# Patient Record
Sex: Male | Born: 1960 | Race: White | Hispanic: No | Marital: Married | Smoking: Current every day smoker
Health system: Southern US, Community
[De-identification: ages and names within clinical notes are randomized; demographics above are authoritative.]

## PROBLEM LIST (undated history)

## (undated) DIAGNOSIS — E079 Disorder of thyroid, unspecified: Secondary | ICD-10-CM

## (undated) DIAGNOSIS — E785 Hyperlipidemia, unspecified: Secondary | ICD-10-CM

## (undated) DIAGNOSIS — E039 Hypothyroidism, unspecified: Secondary | ICD-10-CM

## (undated) DIAGNOSIS — K219 Gastro-esophageal reflux disease without esophagitis: Secondary | ICD-10-CM

## (undated) DIAGNOSIS — R911 Solitary pulmonary nodule: Secondary | ICD-10-CM

## (undated) DIAGNOSIS — Z972 Presence of dental prosthetic device (complete) (partial): Secondary | ICD-10-CM

## (undated) DIAGNOSIS — R072 Precordial pain: Secondary | ICD-10-CM

## (undated) DIAGNOSIS — N401 Enlarged prostate with lower urinary tract symptoms: Secondary | ICD-10-CM

## (undated) DIAGNOSIS — K759 Inflammatory liver disease, unspecified: Secondary | ICD-10-CM

## (undated) DIAGNOSIS — H409 Unspecified glaucoma: Secondary | ICD-10-CM

## (undated) HISTORY — DX: Unspecified glaucoma: H40.9

## (undated) HISTORY — DX: Disorder of thyroid, unspecified: E07.9

## (undated) HISTORY — DX: Hyperlipidemia, unspecified: E78.5

---

## 1988-04-06 HISTORY — PX: HEMORRHOID SURGERY: SHX153

## 2005-08-07 ENCOUNTER — Emergency Department: Payer: Self-pay | Admitting: Emergency Medicine

## 2005-08-13 ENCOUNTER — Ambulatory Visit: Payer: Self-pay | Admitting: Surgery

## 2011-03-17 ENCOUNTER — Emergency Department: Payer: Self-pay | Admitting: *Deleted

## 2012-05-06 ENCOUNTER — Ambulatory Visit: Payer: Self-pay | Admitting: Family Medicine

## 2012-05-06 LAB — DOT URINE DIP
Blood: NEGATIVE
Protein: NEGATIVE
Specific Gravity: 1.01 (ref 1.003–1.030)

## 2013-04-06 DIAGNOSIS — H409 Unspecified glaucoma: Secondary | ICD-10-CM

## 2013-04-06 HISTORY — DX: Unspecified glaucoma: H40.9

## 2014-01-29 ENCOUNTER — Ambulatory Visit: Payer: Self-pay | Admitting: Ophthalmology

## 2014-03-05 ENCOUNTER — Ambulatory Visit: Payer: Self-pay | Admitting: Ophthalmology

## 2014-03-21 ENCOUNTER — Ambulatory Visit: Payer: Self-pay | Admitting: Emergency Medicine

## 2014-03-21 LAB — DOT URINE DIP
BLOOD: NEGATIVE
Glucose,UR: NEGATIVE
Protein: NEGATIVE
Specific Gravity: 1.015 (ref 1.000–1.030)

## 2014-04-06 HISTORY — PX: EYE SURGERY: SHX253

## 2015-09-30 ENCOUNTER — Encounter: Payer: Self-pay | Admitting: *Deleted

## 2015-09-30 ENCOUNTER — Inpatient Hospital Stay: Admission: RE | Admit: 2015-09-30 | Payer: BLUE CROSS/BLUE SHIELD | Source: Ambulatory Visit

## 2015-09-30 ENCOUNTER — Encounter: Payer: Self-pay | Admitting: General Surgery

## 2015-09-30 ENCOUNTER — Ambulatory Visit (INDEPENDENT_AMBULATORY_CARE_PROVIDER_SITE_OTHER): Payer: BLUE CROSS/BLUE SHIELD | Admitting: General Surgery

## 2015-09-30 ENCOUNTER — Other Ambulatory Visit: Payer: Self-pay | Admitting: General Surgery

## 2015-09-30 VITALS — BP 122/68 | HR 70 | Resp 14 | Ht 68.0 in | Wt 177.0 lb

## 2015-09-30 DIAGNOSIS — K610 Anal abscess: Secondary | ICD-10-CM

## 2015-09-30 DIAGNOSIS — K611 Rectal abscess: Secondary | ICD-10-CM

## 2015-09-30 NOTE — Patient Instructions (Signed)
Perirectal Abscess An abscess is an infected area that contains a collection of pus. A perirectal abscess is an abscess that is near the opening of the anus or around the rectum. A perirectal abscess can cause a lot of pain, especially during bowel movements. CAUSES This condition is almost always caused by an infection that starts in an anal gland. RISK FACTORS This condition is more likely to develop in:  People with diabetes or inflammatory bowel disease.  People whose body defense system (immune system) is weak.  People who have anal sex.  People who have a sexually transmitted disease (STD).  People who have certain kinds of cancers, such as rectal carcinoma, leukemia, or lymphoma. SYMPTOMS The main symptom of this condition is pain. The pain may be a throbbing pain that gets worse during bowel movements. Other symptoms include:  Fever.  Swelling.  Redness.  Bleeding.  Constipation. DIAGNOSIS The condition is diagnosed with a physical exam. If the abscess is not visible, a health care provider may need to place a finger inside the rectum to find the abscess. Sometimes, imaging tests are done to determine the size and location of the abscess. These tests may include:  An ultrasound.  An MRI.  A CT scan. TREATMENT This condition is usually treated with incision and drainage surgery. Incision and drainage surgery involves making an incision over the abscess to drain the pus. Treatment may also involve antibiotic medicine, pain medicine, stool softeners, or laxatives. HOME CARE INSTRUCTIONS  Take medicines only as directed by your health care provider.  If you were prescribed an antibiotic, finish all of it even if you start to feel better.  To relieve pain, try sitting:  In a warm, shallow bath (sitz bath).  On a heating pad with the setting on low.  On an inflatable donut-shaped cushion.  Follow any diet instructions as directed by your health care  provider.  Keep all follow-up visits as directed by your health care provider. This is important. SEEK MEDICAL CARE IF:  Your abscess is bleeding.  You have pain, swelling, or redness that is getting worse.  You are constipated.  You feel ill.  You have muscle aches or chills.  You have a fever.  Your symptoms return after the abscess has healed.   This information is not intended to replace advice given to you by your health care provider. Make sure you discuss any questions you have with your health care provider.   Document Released: 03/20/2000 Document Revised: 12/12/2014 Document Reviewed: 01/31/2014 Elsevier Interactive Patient Education 2016 [employer]  

## 2015-09-30 NOTE — Patient Instructions (Signed)
  Your procedure is scheduled on: 10-01-15 Report to Same Day Surgery 2nd floor medical mall @ 9:30  Remember: Instructions that are not followed completely may result in serious medical risk, up to and including death, or upon the discretion of your surgeon and anesthesiologist your surgery may need to be rescheduled.    _x___ 1. Do not eat food or drink liquids after midnight. No gum chewing or hard candies.     __x__ 2. No Alcohol for 24 hours before or after surgery.   __x__3. No Smoking for 24 prior to surgery.   ____  4. Bring all medications with you on the day of surgery if instructed.    __x__ 5. Notify your doctor if there is any change in your medical condition     (cold, fever, infections).     Do not wear jewelry, make-up, hairpins, clips or nail polish.  Do not wear lotions, powders, or perfumes. You may wear deodorant.  Do not shave 48 hours prior to surgery. Men may shave face and neck.  Do not bring valuables to the hospital.    [facility] is not responsible for any belongings or valuables.               Contacts, dentures or bridgework may not be worn into surgery.  Leave your suitcase in the car. After surgery it may be brought to your room.  For patients admitted to the hospital, discharge time is determined by your treatment team.   Patients discharged the day of surgery will not be allowed to drive home.    Please read over the following fact sheets that you were given:   [facility] Preparing for Surgery and or MRSA Information   _x___ Take these medicines the morning of surgery with A SIP OF WATER:    1. LEVOTHYROXINE  2.  3.  4.  5.  6.  _X___ Fleet Enema (as directed)-1 HOUR PRIOR TO ARRIVAL TIME TO HOSPITAL   ____ Use CHG Soap or sage wipes as directed on instruction sheet   ____ Use inhalers on the day of surgery and bring to hospital day of surgery  ____ Stop metformin 2 days prior to surgery    ____ Take 1/2 of usual insulin dose the night  before surgery and none on the morning of surgery.   ____ Stop aspirin or coumadin, or plavix  ___ Stop Anti-inflammatories such as Advil, Aleve, Ibuprofen, Motrin, Naproxen,          Naprosyn, Goodies powders or aspirin products. Ok to take Tylenol.   ____ Stop supplements until after surgery.    ____ Bring C-Pap to the hospital.

## 2015-09-30 NOTE — Progress Notes (Signed)
Patient ID: William Ochoa, male   DOB: 01/16/1961, 55 y.o.   MRN: [id number]  Chief Complaint  Patient presents with  . Other    Rectal abscess    HPI NICOLI Ochoa is a 55 y.o. male here today for an evaluation for a perirectal abscess. The area increased in size for over the past 6 months. It has been painful the last two weeks. He did took an antibiotic for two weeks, does not remember the name. He finished this course last week with no improvement. It is painful to have bowel movements. He states the area has enlarged since this first started. He states he has not had a colonoscopy. He is a Administrator.   I personally reviewed the patient's history. HPI  Past Medical History  Diagnosis Date  . Glaucoma 2015  . Thyroid disease   . Hyperlipidemia   . Hypothyroidism     Past Surgical History  Procedure Laterality Date  . Hemorrhoid surgery  1990  . Eye surgery Left 2016    CORNEAL SURGERY  . Incision and drainage perirectal abscess N/A 10/01/2015    Procedure: IRRIGATION AND DEBRIDEMENT PERIRECTAL ABSCESS;  Surgeon: Robert Bellow, MD;  Location: [facility] ORS;  Service: General;  Laterality: N/A;  . Fistulotomy N/A 10/01/2015    Procedure: FISTULOTOMY;  Surgeon: Robert Bellow, MD;  Location: [facility] ORS;  Service: General;  Laterality: N/A;  . Sigmoidoscopy N/A 10/01/2015    Procedure: SIGMOIDOSCOPY;  Surgeon: Robert Bellow, MD;  Location: [facility] ORS;  Service: General;  Laterality: N/A;    Family History  Problem Relation Age of Onset  . Cancer Mother     skin  . Cancer Father     Prostate    Social History Social History  Substance Use Topics  . Smoking status: Current Every Day Smoker -- 1.50 packs/day for 30 years    Types: Cigarettes  . Smokeless tobacco: Never Used  . Alcohol Use: No     Comment: SOBER FOR 23 YEARS    No Known Allergies  Current Outpatient Prescriptions  Medication Sig Dispense Refill  . levothyroxine (SYNTHROID, LEVOTHROID) 125  MCG tablet Take 125 mcg by mouth daily before breakfast.   0  . Multiple Vitamins-Minerals (CENTRUM ADULTS PO) Take by mouth.    . simvastatin (ZOCOR) 20 MG tablet Take 20 mg by mouth daily at 6 PM.   0  . HYDROcodone-acetaminophen (NORCO) 5-325 MG tablet Take 1-2 tablets by mouth every 4 (four) hours as needed for moderate pain. 30 tablet 0  . naproxen sodium (ANAPROX) 220 MG tablet Take 220 mg by mouth as needed. Reported on 10/01/2015     No current facility-administered medications for this visit.    Review of Systems Review of Systems  Constitutional: Negative.   Respiratory: Negative.   Cardiovascular: Negative.   Gastrointestinal: Positive for constipation and rectal pain.    Blood pressure 122/68, pulse 70, resp. rate 14, height 5\' 8"  (1.727 m), weight 177 lb (80.287 kg).  Physical Exam Physical Exam  Constitutional: He is oriented to person, place, and time. He appears well-developed and well-nourished.  Eyes: Conjunctivae are normal. No scleral icterus.  Neck: Neck supple.  Cardiovascular: Normal rate, regular rhythm and normal heart sounds.   Pulses:      Carotid pulses are 2+ on the right side, and 2+ on the left side.      Radial pulses are 2+ on the right side, and 2+ on the left side.  Femoral pulses are 2+ on the right side, and 2+ on the left side.      Popliteal pulses are 2+ on the right side, and 2+ on the left side.       Dorsalis pedis pulses are 2+ on the right side, and 2+ on the left side.  Pulmonary/Chest: Effort normal and breath sounds normal.  Abdominal: Soft. Normal appearance and bowel sounds are normal. There is no tenderness.  Genitourinary:     Thickening along the midline about halfway to the scrotum. Perirectal abscess on the right.   Lymphadenopathy:    He has no cervical adenopathy.  Neurological: He is alert and oriented to person, place, and time.  Skin: Skin is warm and dry.    Data Reviewed PCP note.  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    Plan    Patient's surgery has been scheduled for 10-01-15 at [facility]. This patient will make use of a Fleets enema prep one hour prior to O.R.     PCP: Dr. Sharin Mons This has been scribed by Lesly Rubenstein LPN   Robert Bellow 10/01/2015, 7:08 PM

## 2015-10-01 ENCOUNTER — Ambulatory Visit: Payer: BLUE CROSS/BLUE SHIELD | Admitting: Anesthesiology

## 2015-10-01 ENCOUNTER — Ambulatory Visit
Admission: RE | Admit: 2015-10-01 | Discharge: 2015-10-01 | Disposition: A | Discharge status: Home / Self Care | Payer: BLUE CROSS/BLUE SHIELD | Source: Ambulatory Visit | Attending: General Surgery | Admitting: General Surgery

## 2015-10-01 ENCOUNTER — Encounter: Payer: Self-pay | Admitting: *Deleted

## 2015-10-01 ENCOUNTER — Encounter
Admission: RE | Disposition: A | Discharge status: Home / Self Care | Payer: Self-pay | Source: Ambulatory Visit | Attending: General Surgery

## 2015-10-01 DIAGNOSIS — E039 Hypothyroidism, unspecified: Secondary | ICD-10-CM | POA: Diagnosis not present

## 2015-10-01 DIAGNOSIS — K611 Rectal abscess: Secondary | ICD-10-CM | POA: Insufficient documentation

## 2015-10-01 DIAGNOSIS — K610 Anal abscess: Secondary | ICD-10-CM

## 2015-10-01 DIAGNOSIS — L02215 Cutaneous abscess of perineum: Secondary | ICD-10-CM | POA: Insufficient documentation

## 2015-10-01 DIAGNOSIS — F17210 Nicotine dependence, cigarettes, uncomplicated: Secondary | ICD-10-CM | POA: Diagnosis not present

## 2015-10-01 DIAGNOSIS — J449 Chronic obstructive pulmonary disease, unspecified: Secondary | ICD-10-CM | POA: Insufficient documentation

## 2015-10-01 HISTORY — PX: FISTULOTOMY: SHX6413

## 2015-10-01 HISTORY — PX: SIGMOIDOSCOPY: SHX6686

## 2015-10-01 HISTORY — DX: Hypothyroidism, unspecified: E03.9

## 2015-10-01 HISTORY — PX: INCISION AND DRAINAGE PERIRECTAL ABSCESS: SHX1804

## 2015-10-01 LAB — URINE DRUG SCREEN, QUALITATIVE (ARMC ONLY)
Amphetamines, Ur Screen: NOT DETECTED
BARBITURATES, UR SCREEN: NOT DETECTED
Benzodiazepine, Ur Scrn: NOT DETECTED
COCAINE METABOLITE, UR ~~LOC~~: NOT DETECTED
Cannabinoid 50 Ng, Ur ~~LOC~~: NOT DETECTED
MDMA (ECSTASY) UR SCREEN: NOT DETECTED
METHADONE SCREEN, URINE: NOT DETECTED
Opiate, Ur Screen: NOT DETECTED
Phencyclidine (PCP) Ur S: NOT DETECTED
TRICYCLIC, UR SCREEN: NOT DETECTED

## 2015-10-01 SURGERY — INCISION AND DRAINAGE, ABSCESS, PERIRECTAL
Anesthesia: General

## 2015-10-01 MED ORDER — LACTATED RINGERS IV SOLN
INTRAVENOUS | Status: DC
  Administered 2015-10-01: 50 mL/h via INTRAVENOUS

## 2015-10-01 MED ORDER — BUPIVACAINE-EPINEPHRINE 0.5% -1:200000 IJ SOLN
INTRAMUSCULAR | Status: DC | PRN
  Administered 2015-10-01: 30 mL

## 2015-10-01 MED ORDER — LIDOCAINE HCL (CARDIAC) 20 MG/ML IV SOLN
INTRAVENOUS | Status: DC | PRN
  Administered 2015-10-01: 100 mg via INTRAVENOUS

## 2015-10-01 MED ORDER — ACETAMINOPHEN 10 MG/ML IV SOLN
INTRAVENOUS | Status: DC | PRN
Start: 2015-10-01 — End: 2015-10-01
  Administered 2015-10-01: 1000 mg via INTRAVENOUS

## 2015-10-01 MED ORDER — KETOROLAC TROMETHAMINE 30 MG/ML IJ SOLN
INTRAMUSCULAR | Status: DC | PRN
  Administered 2015-10-01: 30 mg via INTRAVENOUS

## 2015-10-01 MED ORDER — HYDROCODONE-ACETAMINOPHEN 5-325 MG PO TABS: 1.0000 | ORAL_TABLET | ORAL | Status: DC | PRN

## 2015-10-01 MED ORDER — FAMOTIDINE 20 MG PO TABS
20.0000 mg | ORAL_TABLET | Freq: Once | ORAL | Status: AC
  Administered 2015-10-01: 20 mg via ORAL

## 2015-10-01 MED ORDER — FENTANYL CITRATE (PF) 100 MCG/2ML IJ SOLN: 25.0000 ug | INTRAMUSCULAR | Status: DC | PRN

## 2015-10-01 MED ORDER — BUPIVACAINE-EPINEPHRINE (PF) 0.5% -1:200000 IJ SOLN
INTRAMUSCULAR | Status: AC
  Filled 2015-10-01: qty 30

## 2015-10-01 MED ORDER — GLYCOPYRROLATE 0.2 MG/ML IJ SOLN
INTRAMUSCULAR | Status: DC | PRN
  Administered 2015-10-01: 0.2 mg via INTRAVENOUS

## 2015-10-01 MED ORDER — FAMOTIDINE 20 MG PO TABS
ORAL_TABLET | ORAL | Status: AC
  Administered 2015-10-01: 20 mg
  Filled 2015-10-01: qty 1

## 2015-10-01 MED ORDER — BUPIVACAINE LIPOSOME 1.3 % IJ SUSP
INTRAMUSCULAR | Status: AC
  Filled 2015-10-01: qty 20

## 2015-10-01 MED ORDER — MIDAZOLAM HCL 2 MG/2ML IJ SOLN
INTRAMUSCULAR | Status: DC | PRN
  Administered 2015-10-01: 2 mg via INTRAVENOUS

## 2015-10-01 MED ORDER — FENTANYL CITRATE (PF) 100 MCG/2ML IJ SOLN
INTRAMUSCULAR | Status: DC | PRN
  Administered 2015-10-01 (×3): 50 ug via INTRAVENOUS

## 2015-10-01 MED ORDER — ONDANSETRON HCL 4 MG/2ML IJ SOLN: 4.0000 mg | Freq: Once | INTRAMUSCULAR | Status: DC | PRN

## 2015-10-01 MED ORDER — SODIUM CHLORIDE 0.9 % IV SOLN
1.0000 g | INTRAVENOUS | Status: AC
  Administered 2015-10-01: 1 g via INTRAVENOUS
  Filled 2015-10-01: qty 1

## 2015-10-01 MED ORDER — ONDANSETRON HCL 4 MG/2ML IJ SOLN
INTRAMUSCULAR | Status: DC | PRN
  Administered 2015-10-01: 4 mg via INTRAVENOUS

## 2015-10-01 MED ORDER — PROPOFOL 10 MG/ML IV BOLUS
INTRAVENOUS | Status: DC | PRN
  Administered 2015-10-01: 160 mg via INTRAVENOUS

## 2015-10-01 MED ORDER — ACETAMINOPHEN 10 MG/ML IV SOLN
INTRAVENOUS | Status: AC
  Filled 2015-10-01: qty 100

## 2015-10-01 MED ORDER — BACITRACIN ZINC 500 UNIT/GM EX OINT
TOPICAL_OINTMENT | CUTANEOUS | Status: AC
  Filled 2015-10-01: qty 28.35

## 2015-10-01 SURGICAL SUPPLY — 35 items
BLADE SURG 11 STRL SS SAFETY (MISCELLANEOUS) ×3 IMPLANT
BLADE SURG 15 STRL SS SAFETY (BLADE) ×3 IMPLANT
BRIEF STRETCH MATERNITY 2XLG (MISCELLANEOUS) ×3 IMPLANT
CANISTER SUCT 1200ML W/VALVE (MISCELLANEOUS) ×3 IMPLANT
DRAIN PENROSE 5/8X18 LTX STRL (WOUND CARE) IMPLANT
DRAPE LAPAROTOMY 100X77 ABD (DRAPES) ×3 IMPLANT
DRAPE LAPAROTOMY T 102X78X121 (DRAPES) ×3 IMPLANT
DRAPE LEGGINS SURG 28X43 STRL (DRAPES) ×3 IMPLANT
DRAPE UNDER BUTTOCK W/FLU (DRAPES) ×3 IMPLANT
DRSG GAUZE PETRO 6X36 STRIP ST (GAUZE/BANDAGES/DRESSINGS) ×3 IMPLANT
ELECT REM PT RETURN 9FT ADLT (ELECTROSURGICAL) ×3
ELECTRODE REM PT RTRN 9FT ADLT (ELECTROSURGICAL) ×1 IMPLANT
GLOVE BIO SURGEON STRL SZ7.5 (GLOVE) ×12 IMPLANT
GLOVE INDICATOR 8.0 STRL GRN (GLOVE) ×6 IMPLANT
GOWN STRL REUS W/ TWL LRG LVL3 (GOWN DISPOSABLE) ×3 IMPLANT
GOWN STRL REUS W/TWL LRG LVL3 (GOWN DISPOSABLE) ×6
KIT RM TURNOVER STRD PROC AR (KITS) ×3 IMPLANT
LABEL OR SOLS (LABEL) ×3 IMPLANT
NDL SAFETY 22GX1.5 (NEEDLE) ×3 IMPLANT
NEEDLE HYPO 25X1 1.5 SAFETY (NEEDLE) ×3 IMPLANT
NS IRRIG 500ML POUR BTL (IV SOLUTION) ×3 IMPLANT
PACK BASIN MINOR ARMC (MISCELLANEOUS) ×3 IMPLANT
PAD OB MATERNITY 4.3X12.25 (PERSONAL CARE ITEMS) ×3 IMPLANT
PAD PREP 24X41 OB/GYN DISP (PERSONAL CARE ITEMS) ×3 IMPLANT
SOL PREP PVP 2OZ (MISCELLANEOUS) ×3
SOLUTION PREP PVP 2OZ (MISCELLANEOUS) ×1 IMPLANT
SUCT SIGMOIDOSCOPE TIP 18 W/TU (SUCTIONS) ×3 IMPLANT
SURGILUBE 2OZ TUBE FLIPTOP (MISCELLANEOUS) ×3 IMPLANT
SUT SILK 0 CT 1 30 (SUTURE) IMPLANT
SUT SILK 0 SH 30 (SUTURE) IMPLANT
SUT VIC AB 3-0 SH 27 (SUTURE) ×2
SUT VIC AB 3-0 SH 27X BRD (SUTURE) ×1 IMPLANT
SWAB CULTURE AMIES ANAERIB BLU (MISCELLANEOUS) IMPLANT
SYR BULB IRRIG 60ML STRL (SYRINGE) ×3 IMPLANT
SYR CONTROL 10ML (SYRINGE) ×3 IMPLANT

## 2015-10-01 NOTE — Transfer of Care (Signed)
Immediate Anesthesia Transfer of Care Note  Patient: William Ochoa  Procedure(s) Performed: Procedure(s): IRRIGATION AND DEBRIDEMENT PERIRECTAL ABSCESS (N/A) FISTULOTOMY (N/A) SIGMOIDOSCOPY (N/A)  Patient Location: PACU  Anesthesia Type:General  Level of Consciousness: sedated  Airway & Oxygen Therapy: Patient Spontanous Breathing and Patient connected to face mask oxygen  Post-op Assessment: Report given to RN and Post -op Vital signs reviewed and stable  Post vital signs: Reviewed and stable  Last Vitals:  Filed Vitals:   10/01/15 1000 10/01/15 1203  BP: 121/83 86/54  Pulse: 70   Temp: 36.2 C 36.2 C  Resp: 17 12    Complications: No apparent anesthesia complications

## 2015-10-01 NOTE — Op Note (Signed)
Preoperative diagnosis: Perineal abscess, suspected fistula.  Postoperative diagnosis same.  Operative procedure: Rigid sigmoidoscopy, anoscopy, debridement of perineal abscess.  Operating surgeon: Hervey Ard, M.D.  Anesthesia: Gen. by LMA, Marcaine 0.5% with 1-200,000 units of epinephrine: 30 mL local infiltration.  Estimated blood loss: Less than 5 mL.  Clinical note: This 55 year old male related to 6 month plus history of intermittent pain and swelling in the perineum. Examination showed evidence of a abscess between the anus and the scrotum as well as a draining sinus at the 10:00 position (dorsal lithotomy). 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  Operative note: The patient received Invanz prior to the procedure. He underwent general anesthesia without difficulty. The perineum was prepped with Betadine solution and draped. Marcaine was infiltrated for postoperative analgesia.  Rigid sigmoidoscopy was carried out to 20 cm. No mucosal lesions were identified.  The fistula site was cannulated with a lacrimal duct probe and the anterior superior extent towards the base of the scrotum identified. The skin was incised sharply and the remaining dissection completed with electrocautery. A chronic abscess cavity was entered and this was debrided with a curet. A secondary cavity deep to the superficial wound measuring less than a centimeter in diameter and 6 mm in depth was cleared as well. Probing showed a tract extending to but not reaching the dentate line. This was exposed for its length. Hemostasis was electrocautery. Bacitracin ointment was applied to the wound. Dry dressing and mesh underwear applied.  The patient was taken to recovery in stable condition.

## 2015-10-01 NOTE — Anesthesia Preprocedure Evaluation (Addendum)
Anesthesia Evaluation  Patient identified by MRN, date of birth, ID band Patient awake    Reviewed: Allergy & Precautions, NPO status , Patient's Chart, lab work & pertinent test results  Airway Mallampati: II       Dental  (+) Edentulous Upper   Pulmonary COPD, Current Smoker,    + rhonchi  + decreased breath sounds      Cardiovascular Exercise Tolerance: Good  Rhythm:Regular     Neuro/Psych    GI/Hepatic negative GI ROS,   Endo/Other  Hypothyroidism   Renal/GU negative Renal ROS     Musculoskeletal negative musculoskeletal ROS (+)   Abdominal   Peds  Hematology   Anesthesia Other Findings   Reproductive/Obstetrics                            Anesthesia Physical Anesthesia Plan  ASA: II  Anesthesia Plan: General   Post-op Pain Management:    Induction: Intravenous  Airway Management Planned: LMA  Additional Equipment:   Intra-op Plan:   Post-operative Plan: Extubation in OR  Informed Consent: I have reviewed the patients History and Physical, chart, labs and discussed the procedure including the risks, benefits and alternatives for the proposed anesthesia with the patient or authorized representative who has indicated his/her understanding and acceptance.     Plan Discussed with: CRNA  Anesthesia Plan Comments:         Anesthesia Quick Evaluation

## 2015-10-01 NOTE — H&P (Signed)
No change in clinical history or exam.  For EUA, fistulotomy, abscess drainage.

## 2015-10-01 NOTE — Anesthesia Procedure Notes (Signed)
Procedure Name: LMA Insertion Date/Time: 10/01/2015 11:19 AM Performed by: Doreen Salvage Pre-anesthesia Checklist: Patient identified, Emergency Drugs available, Suction available and Patient being monitored Patient Re-evaluated:Patient Re-evaluated prior to inductionOxygen Delivery Method: Circle system utilized Preoxygenation: Pre-oxygenation with 100% oxygen Intubation Type: IV induction Ventilation: Mask ventilation without difficulty LMA: LMA inserted LMA Size: 4.5 Number of attempts: 1 Placement Confirmation: positive ETCO2 and breath sounds checked- equal and bilateral Tube secured with: Tape Dental Injury: Teeth and Oropharynx as per pre-operative assessment

## 2015-10-01 NOTE — Anesthesia Postprocedure Evaluation (Signed)
Anesthesia Post Note  Patient: William Ochoa  Procedure(s) Performed: Procedure(s) (LRB): IRRIGATION AND DEBRIDEMENT PERIRECTAL ABSCESS (N/A) FISTULOTOMY (N/A) SIGMOIDOSCOPY (N/A)  Patient location during evaluation: PACU Anesthesia Type: General Level of consciousness: awake Pain management: satisfactory to patient Vital Signs Assessment: post-procedure vital signs reviewed and stable Respiratory status: respiratory function stable Cardiovascular status: stable Anesthetic complications: no    Last Vitals:  Filed Vitals:   10/01/15 1000 10/01/15 1203  BP: 121/83 86/54  Pulse: 70 71  Temp: 36.2 C 36.2 C  Resp: 17 12    Last Pain:  Filed Vitals:   10/01/15 1212  PainSc: Asleep                 [name],William Marlette

## 2015-10-09 ENCOUNTER — Ambulatory Visit (INDEPENDENT_AMBULATORY_CARE_PROVIDER_SITE_OTHER): Payer: BLUE CROSS/BLUE SHIELD | Admitting: General Surgery

## 2015-10-09 ENCOUNTER — Encounter: Payer: Self-pay | Admitting: *Deleted

## 2015-10-09 VITALS — BP 120/80 | HR 77 | Temp 98.1°F | Resp 13 | Ht 68.0 in | Wt 177.0 lb

## 2015-10-09 DIAGNOSIS — K611 Rectal abscess: Secondary | ICD-10-CM

## 2015-10-09 MED ORDER — HYDROCODONE-ACETAMINOPHEN 5-325 MG PO TABS: 1.0000 | ORAL_TABLET | ORAL | Status: DC | PRN

## 2015-10-09 NOTE — Patient Instructions (Signed)
Use Aleve two twice daily for pain and swelling. May try heat or cold as needed for comfort.

## 2015-10-09 NOTE — Progress Notes (Addendum)
Patient ID: William Ochoa, male   DOB: September 14, 1960, 55 y.o.   MRN: [id number]  Chief Complaint  Patient presents with  . Wound Check    HPI William Ochoa is a 55 y.o. male here for wound check. He states that the preirectal area is still very sore and he reports a lot of drainage. He reports that he is having to to take his pain medication every 4 hours. His surgery was on 10/01/15. He denies any chills or fevers. He states that the area feels worse now than before surgery.   The patient reports no difficulty controlling his bowels. HPI  Past Medical History:  Diagnosis Date  . Glaucoma 2015  . Hyperlipidemia   . Hypothyroidism   . Thyroid disease     Past Surgical History:  Procedure Laterality Date  . EYE SURGERY Left 2016   CORNEAL SURGERY  . FISTULOTOMY N/A 10/01/2015   Procedure: FISTULOTOMY;  Surgeon: Robert Bellow, MD;  Location: [facility] ORS;  Service: General;  Laterality: N/A;  . [place]  . INCISION AND DRAINAGE PERIRECTAL ABSCESS N/A 10/01/2015   Procedure: IRRIGATION AND DEBRIDEMENT PERIRECTAL ABSCESS;  Surgeon: Robert Bellow, MD;  Location: [facility] ORS;  Service: General;  Laterality: N/A;  . SIGMOIDOSCOPY N/A 10/01/2015   Procedure: [place] Face;  Surgeon: Robert Bellow, MD;  Location: [facility] ORS;  Service: General;  Laterality: N/A;    Family History  Problem Relation Age of Onset  . Cancer Mother     skin  . Cancer Father     Prostate    Social History Social History  Substance Use Topics  . Smoking status: Current Every Day Smoker    Packs/day: 1.50    Years: 30.00    Types: Cigarettes  . Smokeless tobacco: Never Used  . Alcohol use No     Comment: SOBER FOR 23 YEARS    No Known Allergies  Current Outpatient Prescriptions  Medication Sig Dispense Refill  . levothyroxine (SYNTHROID, LEVOTHROID) 125 MCG tablet Take 125 mcg by mouth daily before breakfast.   0  . Multiple Vitamins-Minerals (CENTRUM ADULTS PO) Take by  mouth.    . naproxen sodium (ANAPROX) 220 MG tablet Take 220 mg by mouth as needed. Reported on 10/01/2015    . simvastatin (ZOCOR) 20 MG tablet Take 20 mg by mouth daily at 6 PM.   0  . docusate sodium (COLACE) 100 MG capsule Take 100 mg by mouth daily.    Marland Kitchen HYDROcodone-acetaminophen (NORCO) 5-325 MG tablet Take 1-2 tablets by mouth every 4 (four) hours as needed for moderate pain. 30 tablet 0   No current facility-administered medications for this visit.     Review of Systems Review of Systems  Constitutional: Negative.   Respiratory: Negative.   Cardiovascular: Negative.     Blood pressure 120/80, pulse 77, temperature 98.1 F (36.7 C), resp. rate 13, height 5\' 8"  (1.727 m), weight 177 lb (80.3 kg).  Physical Exam Physical Exam  Genitourinary:     Area healing well     Assessment    Clean perineal wound status post exploration and debridement.    Plan    The patient was reassured that this area will improve. Continue present anti-inflammatory regimen. Hydrocodone as needed.    PCP: Sharin Mons This has been scribed by Lesly Rubenstein LPN    Robert Bellow 10/31/2015, 12:15 PM

## 2015-10-15 ENCOUNTER — Encounter: Payer: Self-pay | Admitting: General Surgery

## 2015-10-15 ENCOUNTER — Ambulatory Visit (INDEPENDENT_AMBULATORY_CARE_PROVIDER_SITE_OTHER): Payer: BLUE CROSS/BLUE SHIELD | Admitting: General Surgery

## 2015-10-15 VITALS — BP 100/60 | HR 70 | Resp 12 | Ht 68.0 in | Wt 177.0 lb

## 2015-10-15 DIAGNOSIS — K611 Rectal abscess: Secondary | ICD-10-CM

## 2015-10-15 NOTE — Patient Instructions (Signed)
Call office with any concerns.

## 2015-10-15 NOTE — Progress Notes (Signed)
Patient ID: William Ochoa, male   DOB: 1960/07/14, 55 y.o.   MRN: [id number]  Chief Complaint  Patient presents with  . Routine Post Op    HPI William Ochoa is a 55 y.o. male.  Here today for postoperative visit, I/D perirectal abscess and fistulotomy on 10-01-15. He states he is still having some drainage but it is much less. He is still taking the hydrocodone but less often and plans to return to work Monday.   HPI  Past Medical History  Diagnosis Date  . Glaucoma 2015  . Thyroid disease   . Hyperlipidemia   . Hypothyroidism     Past Surgical History  Procedure Laterality Date  . Hemorrhoid surgery  1990  . Eye surgery Left 2016    CORNEAL SURGERY  . Incision and drainage perirectal abscess N/A 10/01/2015    Procedure: IRRIGATION AND DEBRIDEMENT PERIRECTAL ABSCESS;  Surgeon: Robert Bellow, MD;  Location: [facility] ORS;  Service: General;  Laterality: N/A;  . Fistulotomy N/A 10/01/2015    Procedure: FISTULOTOMY;  Surgeon: Robert Bellow, MD;  Location: [facility] ORS;  Service: General;  Laterality: N/A;  . Sigmoidoscopy N/A 10/01/2015    Procedure: SIGMOIDOSCOPY;  Surgeon: Robert Bellow, MD;  Location: [facility] ORS;  Service: General;  Laterality: N/A;    Family History  Problem Relation Age of Onset  . Cancer Mother     skin  . Cancer Father     Prostate    Social History Social History  Substance Use Topics  . Smoking status: Current Every Day Smoker -- 1.50 packs/day for 30 years    Types: Cigarettes  . Smokeless tobacco: Never Used  . Alcohol Use: No     Comment: SOBER FOR 23 YEARS    No Known Allergies  Current Outpatient Prescriptions  Medication Sig Dispense Refill  . docusate sodium (COLACE) 100 MG capsule Take 100 mg by mouth daily.    Marland Kitchen HYDROcodone-acetaminophen (NORCO) 5-325 MG tablet Take 1-2 tablets by mouth every 4 (four) hours as needed for moderate pain. 30 tablet 0  . levothyroxine (SYNTHROID, LEVOTHROID) 125 MCG tablet Take 125 mcg by mouth  daily before breakfast.   0  . Multiple Vitamins-Minerals (CENTRUM ADULTS PO) Take by mouth.    . naproxen sodium (ANAPROX) 220 MG tablet Take 220 mg by mouth as needed. Reported on 10/01/2015    . simvastatin (ZOCOR) 20 MG tablet Take 20 mg by mouth daily at 6 PM.   0   No current facility-administered medications for this visit.    Review of Systems Review of Systems  Constitutional: Negative.   Respiratory: Negative.   Cardiovascular: Negative.   Gastrointestinal: Negative for diarrhea and constipation.    Blood pressure 100/60, pulse 70, resp. rate 12, height 5\' 8"  (1.727 m), weight 177 lb (80.287 kg).  Physical Exam Physical Exam  Genitourinary:         Assessment    Steady progress status post incision and drainage of a perirectal abscess.    Plan    Continue local wound care.  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  Follow-up exam in 1 month.     PCP:  Brynda Greathouse,  This information has been scribed by Karie Fetch RN, BSN,BC.   Robert Bellow 10/15/2015, 9:41 PM

## 2015-11-12 ENCOUNTER — Encounter: Payer: Self-pay | Admitting: *Deleted

## 2015-11-19 ENCOUNTER — Encounter: Payer: Self-pay | Admitting: General Surgery

## 2015-11-19 ENCOUNTER — Ambulatory Visit (INDEPENDENT_AMBULATORY_CARE_PROVIDER_SITE_OTHER): Payer: BLUE CROSS/BLUE SHIELD | Admitting: General Surgery

## 2015-11-19 VITALS — BP 116/78 | HR 74 | Resp 16 | Ht 68.0 in | Wt 178.0 lb

## 2015-11-19 DIAGNOSIS — K611 Rectal abscess: Secondary | ICD-10-CM

## 2015-11-19 NOTE — Progress Notes (Addendum)
Patient ID: William Ochoa, male   DOB: 1960/10/02, 55 y.o.   MRN: [id number]  Chief Complaint  Patient presents with  . Follow-up    perirectal abscess    HPI William Ochoa is a 55 y.o. male here today for his follow up I &D perirectal abscess and fistulotomy on 10-01-15. Patient states he is doing well, reports only a little blood when he wipes.  HPI  Past Medical History:  Diagnosis Date  . Glaucoma 2015  . Hyperlipidemia   . Hypothyroidism   . Thyroid disease     Past Surgical History:  Procedure Laterality Date  . EYE SURGERY Left 2016   CORNEAL SURGERY  . FISTULOTOMY N/A 10/01/2015   Procedure: FISTULOTOMY;  Surgeon: Robert Bellow, MD;  Location: [facility];  Service: General;  Laterality: N/A;  . [place]  . INCISION AND DRAINAGE PERIRECTAL ABSCESS N/A 10/01/2015   Procedure: IRRIGATION AND DEBRIDEMENT PERIRECTAL ABSCESS;  Surgeon: Robert Bellow, MD;  Location: [facility];  Service: General;  Laterality: N/A;  . SIGMOIDOSCOPY N/A 10/01/2015   Procedure: Lonell Face;  Surgeon: Robert Bellow, MD;  Location: [facility];  Service: General;  Laterality: N/A;    Family History  Problem Relation Age of Onset  . Cancer Mother     skin  . Cancer Father     Prostate    Social History Social History  Substance Use Topics  . Smoking status: Current Every Day Smoker    Packs/day: 1.50    Years: 30.00    Types: Cigarettes  . Smokeless tobacco: Never Used  . Alcohol use No     Comment: SOBER FOR 23 YEARS    No Known Allergies  Current Outpatient Prescriptions  Medication Sig Dispense Refill  . docusate sodium (COLACE) 100 MG capsule Take 100 mg by mouth daily.    Marland Kitchen HYDROcodone-acetaminophen (NORCO) 5-325 MG tablet Take 1-2 tablets by mouth every 4 (four) hours as needed for moderate pain. 30 tablet 0  . levothyroxine (SYNTHROID, LEVOTHROID) 125 MCG tablet Take 125 mcg by mouth daily before breakfast.   0  . Multiple Vitamins-Minerals  (CENTRUM ADULTS PO) Take by mouth.    . naproxen sodium (ANAPROX) 220 MG tablet Take 220 mg by mouth as needed. Reported on 10/01/2015    . simvastatin (ZOCOR) 20 MG tablet Take 20 mg by mouth daily at 6 PM.   0   No current facility-administered medications for this visit.     Review of Systems Review of Systems  Constitutional: Negative.   Respiratory: Negative.   Cardiovascular: Negative.     Blood pressure 116/78, pulse 74, resp. rate 16, height 5\' 8"  (1.727 m), weight 178 lb (80.7 kg).  Physical Exam Physical Exam  Constitutional: He is oriented to person, place, and time. He appears well-developed and well-nourished.  Eyes: Conjunctivae are normal. No scleral icterus.  Cardiovascular: Normal rate, regular rhythm and normal heart sounds.   Pulmonary/Chest: Effort normal and breath sounds normal.  Genitourinary:     Genitourinary Comments: Anterior fissure healing well  Lymphadenopathy:    He has no cervical adenopathy.  Neurological: He is alert and oriented to person, place, and time.  Skin: Skin is warm and dry.       Assessment    Study progress status post drainage of perianal fistula.    Plan    Local heat and massage may help resolve the residual induration more quickly.  The patient was encouraged to call should  he notice any recurrent symptoms.    Follow up as needed   Robert Bellow 11/20/2015, 8:12 PM

## 2016-02-21 ENCOUNTER — Encounter: Payer: Self-pay | Admitting: Emergency Medicine

## 2016-02-21 ENCOUNTER — Ambulatory Visit
Admission: EM | Admit: 2016-02-21 | Discharge: 2016-02-21 | Disposition: A | Discharge status: Home / Self Care | Payer: PRIVATE HEALTH INSURANCE | Attending: Family Medicine | Admitting: Family Medicine

## 2016-02-21 DIAGNOSIS — Z024 Encounter for examination for driving license: Secondary | ICD-10-CM

## 2016-02-21 LAB — DEPT OF TRANSP DIPSTICK, URINE (ARMC ONLY)
Glucose, UA: NEGATIVE mg/dL
Hgb urine dipstick: NEGATIVE
Protein, ur: NEGATIVE mg/dL
Specific Gravity, Urine: 1.015 (ref 1.005–1.030)

## 2016-02-21 NOTE — ED Provider Notes (Signed)
CSN: [id number]     Arrival date & time 02/21/16  1224 History   First MD Initiated Contact with Patient 02/21/16 1445     Chief Complaint  Patient presents with  . DOT Physical   (Consider location/radiation/quality/duration/timing/severity/associated sxs/prior Treatment) HPI  This a 55 year old male who presents for a DOT physical. Is a history of hypo-thyroid and hypercholesterolemia. 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 He states that he had surgery and in the eye to equalize the pressure.      Past Medical History:  Diagnosis Date  . Glaucoma 2015  . Hyperlipidemia   . Hypothyroidism   . Thyroid disease    Past Surgical History:  Procedure Laterality Date  . EYE SURGERY Left 2016   CORNEAL SURGERY  . FISTULOTOMY N/A 10/01/2015   Procedure: FISTULOTOMY;  Surgeon: Robert Bellow, MD;  Location: [facility];  Service: General;  Laterality: N/A;  . [place]  . INCISION AND DRAINAGE PERIRECTAL ABSCESS N/A 10/01/2015   Procedure: IRRIGATION AND DEBRIDEMENT PERIRECTAL ABSCESS;  Surgeon: Robert Bellow, MD;  Location: [facility];  Service: General;  Laterality: N/A;  . SIGMOIDOSCOPY N/A 10/01/2015   Procedure: Lonell Face;  Surgeon: Robert Bellow, MD;  Location: [facility];  Service: General;  Laterality: N/A;   Family History  Problem Relation Age of Onset  . Cancer Mother     skin  . Cancer Father     Prostate   Social History  Substance Use Topics  . Smoking status: Current Every Day Smoker    Packs/day: 1.50    Years: 30.00    Types: Cigarettes  . Smokeless tobacco: Never Used  . Alcohol use No     Comment: SOBER FOR 23 YEARS    Review of Systems  All other systems reviewed and are negative.   Allergies  Patient has no known allergies.  Home Medications   Prior to Admission medications   Medication Sig Start Date End Date Taking? Authorizing Provider  docusate sodium (COLACE) 100 MG capsule Take 100 mg by  mouth daily.    Historical Provider, MD  levothyroxine (SYNTHROID, LEVOTHROID) 125 MCG tablet Take 125 mcg by mouth daily before breakfast.  09/07/15   Historical Provider, MD  Multiple Vitamins-Minerals (CENTRUM ADULTS PO) Take by mouth.    Historical Provider, MD  naproxen sodium (ANAPROX) 220 MG tablet Take 220 mg by mouth as needed. Reported on 10/01/2015    Historical Provider, MD  simvastatin (ZOCOR) 20 MG tablet Take 20 mg by mouth daily at 6 PM.  09/20/15   Historical Provider, MD   Meds Ordered and Administered this Visit  Medications - No data to display  BP 115/70 (BP Location: Right Arm)   Pulse 69   Temp 97.7 F (36.5 C) (Tympanic)   Resp 16   Ht 5' 7.5" (1.715 m)   Wt 180 lb (81.6 kg)   SpO2 100%   BMI 27.78 kg/m  No data found.   Physical Exam  Constitutional: He appears well-developed and well-nourished. No distress.  Referred to DOT physical sheet.  Skin: He is not diaphoretic.  Nursing note and vitals reviewed.   Urgent Care Course   Clinical Course     Procedures (including critical care time)  Labs Review Labs Reviewed  DEPT OF TRANSP DIPSTICK, URINE([facility] ONLY)    Imaging Review No results found.   Visual Acuity Review  Right Eye Distance:  (20/50 uncorrected) Left Eye Distance:  (20/50 uncorrected)  Bilateral Distance:  (20/50 uncorrected)  Right Eye Near:   Left Eye Near:    Bilateral Near:     Patient returned after seeing the ophthalmologist who performed an eye exam and provided documentation that his vision is 20/30 in the left eye 20/30 in the right eye and 20/20 with both eyes.       MDM   1. 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 Given him 2 years since the medications that he is on are not subject one-year  restrictions. He understands that his follow-up for certification will require ophthalmology confirmation  that his vision is within the DOT standards.    Lorin Picket, PA-C 02/21/16 1704

## 2016-02-21 NOTE — ED Triage Notes (Signed)
Patient here for DOT Physical.  

## 2017-01-05 ENCOUNTER — Ambulatory Visit
Admission: EM | Admit: 2017-01-05 | Discharge: 2017-01-05 | Disposition: A | Discharge status: Home / Self Care | Payer: BLUE CROSS/BLUE SHIELD | Attending: Family Medicine | Admitting: Family Medicine

## 2017-01-05 DIAGNOSIS — H6121 Impacted cerumen, right ear: Secondary | ICD-10-CM

## 2017-01-05 DIAGNOSIS — H9191 Unspecified hearing loss, right ear: Secondary | ICD-10-CM

## 2017-01-05 NOTE — ED Provider Notes (Signed)
[facility]    CSN: [id number] Arrival date & time: 01/05/17  1258   History   Chief Complaint Chief Complaint  Patient presents with  . Ear Fullness   HPI  56 year old male presents with cerumen impaction.  Patient reports that he's had ear fullness and decreased hearing with past few days. Right greater than left. He states that this is happened to him previously. No recent fever or chills. No drainage from the ear. He's tried some over-the-counter remedies without improvement. No known exacerbating factors. He reports associated tinnitus. No other complaints or concerns at this time.  Past Medical History:  Diagnosis Date  . Glaucoma 2015  . Hyperlipidemia   . Hypothyroidism   . Thyroid disease     Patient Active Problem List   Diagnosis Date Noted  . Perirectal abscess 10/01/2015    Past Surgical History:  Procedure Laterality Date  . EYE SURGERY Left 2016   CORNEAL SURGERY  . FISTULOTOMY N/A 10/01/2015   Procedure: FISTULOTOMY;  Surgeon: Robert Bellow, MD;  Location: [facility];  Service: General;  Laterality: N/A;  . [place]  . INCISION AND DRAINAGE PERIRECTAL ABSCESS N/A 10/01/2015   Procedure: IRRIGATION AND DEBRIDEMENT PERIRECTAL ABSCESS;  Surgeon: Robert Bellow, MD;  Location: [facility];  Service: General;  Laterality: N/A;  . SIGMOIDOSCOPY N/A 10/01/2015   Procedure: Lonell Face;  Surgeon: Robert Bellow, MD;  Location: [facility];  Service: General;  Laterality: N/A;    Home Medications    Prior to Admission medications   Medication Sig Start Date End Date Taking? Authorizing Provider  levothyroxine (SYNTHROID, LEVOTHROID) 125 MCG tablet Take 125 mcg by mouth daily before breakfast.  09/07/15  Yes [provider]  Multiple Vitamins-Minerals (CENTRUM ADULTS PO) Take by mouth.   Yes [provider]  naproxen sodium (ANAPROX) 220 MG tablet Take 220 mg by mouth as needed. Reported on 10/01/2015   Yes [provider]  simvastatin (ZOCOR) 20 MG tablet Take 20 mg by mouth daily at 6 PM.  09/20/15  Yes [provider]  docusate sodium (COLACE) 100 MG capsule Take 100 mg by mouth daily.    [provider]    Family History Family History  Problem Relation Age of Onset  . Cancer Mother        skin  . Cancer Father        Prostate    Social History Social History  Substance Use Topics  . Smoking status: Current Every Day Smoker    Packs/day: 1.50    Years: 30.00    Types: Cigarettes  . Smokeless tobacco: Never Used  . 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   Review of Systems Review of Systems  Constitutional: Negative.   HENT: Positive for hearing loss and tinnitus. Negative for ear discharge and ear pain.    Physical Exam Triage Vital Signs ED Triage Vitals  Enc Vitals Group     BP 01/05/17 1320 94/60     Pulse Rate 01/05/17 1320 72     Resp 01/05/17 1320 17     Temp 01/05/17 1320 97.7 F (36.5 C)     Temp Source 01/05/17 1320 Oral     SpO2 01/05/17 1320 97 %     Weight 01/05/17 1319 175 lb (79.4 kg)     Height 01/05/17 1319 5\' 8"  (1.727 m)  Head Circumference --      Peak Flow --      Pain Score 01/05/17 1319 5     Pain Loc --      Pain Edu? --      Excl. in [place]? --    Updated Vital Signs BP 94/60 (BP Location: Left Arm)   Pulse 72   Temp 97.7 F (36.5 C) (Oral)   Resp 17   Ht 5\' 8"  (1.727 m)   Wt 175 lb (79.4 kg)   SpO2 97%   BMI 26.61 kg/m   Physical Exam  Constitutional: He is oriented to person, place, and time. He appears well-developed. No distress.  HENT:  Head: Normocephalic and atraumatic.  Right TM obscured by cerumen. Left TM normal.  Cardiovascular: Normal rate and regular rhythm.   No murmur heard. Pulmonary/Chest: Effort normal and breath sounds normal. He has no wheezes. He has no rales.  Neurological: He is alert and oriented to person, place, and time.   Psychiatric: He has a normal mood and affect.  Vitals reviewed.  UC Treatments / Results  Labs (all labs ordered are listed, but only abnormal results are displayed) Labs Reviewed - No data to display  EKG  EKG Interpretation None       Radiology No results found.  Procedures Procedures (including critical care time)  Medications Ordered in UC Medications - No data to display   Initial Impression / Assessment and Plan / UC Course  I have reviewed the triage vital signs and the nursing notes.  Pertinent labs & imaging results that were available during my care of the patient were reviewed by me and considered in my medical decision making (see chart for details).    56 year old male presents with cerumen impaction. Irrigated successfully today. Advise OTC Debrox as needed.  Final Clinical Impressions(s) / UC Diagnoses   Final diagnoses:  Impacted cerumen of right ear    New Prescriptions New Prescriptions   No medications on file   Controlled Substance Prescriptions [place] Controlled Substance Registry consulted? Not Applicable   Coral Spikes, DO 01/05/17 1420

## 2017-01-05 NOTE — ED Triage Notes (Signed)
Patient states that he has bilateral ear fullness. Patient states that he has ringing in his ears. Patient states that every couple of years he has to have his ears flushed. Patient states that he has tried OTC ear wax removal kit without success.

## 2017-01-05 NOTE — Discharge Instructions (Signed)
OTC Debrox as needed.  Take care  Dr. Lacinda Axon

## 2017-01-28 ENCOUNTER — Emergency Department: Payer: Worker's Compensation

## 2017-01-28 ENCOUNTER — Emergency Department
Admission: EM | Admit: 2017-01-28 | Discharge: 2017-01-28 | Disposition: A | Discharge status: Home / Self Care | Payer: Worker's Compensation | Attending: Emergency Medicine | Admitting: Emergency Medicine

## 2017-01-28 DIAGNOSIS — Y9389 Activity, other specified: Secondary | ICD-10-CM | POA: Insufficient documentation

## 2017-01-28 DIAGNOSIS — X500XXA Overexertion from strenuous movement or load, initial encounter: Secondary | ICD-10-CM | POA: Insufficient documentation

## 2017-01-28 DIAGNOSIS — E039 Hypothyroidism, unspecified: Secondary | ICD-10-CM | POA: Insufficient documentation

## 2017-01-28 DIAGNOSIS — F17210 Nicotine dependence, cigarettes, uncomplicated: Secondary | ICD-10-CM | POA: Diagnosis not present

## 2017-01-28 DIAGNOSIS — Y990 Civilian activity done for income or pay: Secondary | ICD-10-CM | POA: Insufficient documentation

## 2017-01-28 DIAGNOSIS — Z79899 Other long term (current) drug therapy: Secondary | ICD-10-CM | POA: Insufficient documentation

## 2017-01-28 DIAGNOSIS — S4991XA Unspecified injury of right shoulder and upper arm, initial encounter: Secondary | ICD-10-CM

## 2017-01-28 DIAGNOSIS — Y9289 Other specified places as the place of occurrence of the external cause: Secondary | ICD-10-CM | POA: Insufficient documentation

## 2017-01-28 MED ORDER — LIDOCAINE 5 % EX PTCH
1.0000 | MEDICATED_PATCH | CUTANEOUS | Status: DC
  Administered 2017-01-28: 1 via TRANSDERMAL
  Filled 2017-01-28: qty 1

## 2017-01-28 MED ORDER — TRAMADOL HCL 50 MG PO TABS: 50.0000 mg | ORAL_TABLET | Freq: Four times a day (QID) | ORAL | 0 refills | Status: DC | PRN

## 2017-01-28 MED ORDER — LIDOCAINE 5 % EX PTCH: 1.0000 | MEDICATED_PATCH | CUTANEOUS | 0 refills | Status: AC

## 2017-01-28 MED ORDER — OXYCODONE-ACETAMINOPHEN 5-325 MG PO TABS
1.0000 | ORAL_TABLET | Freq: Once | ORAL | Status: AC
  Administered 2017-01-28: 1 via ORAL
  Filled 2017-01-28: qty 1

## 2017-01-28 NOTE — ED Triage Notes (Signed)
Pt in with co right shoulder pain states was at work pulling on a bar.

## 2017-02-02 NOTE — ED Provider Notes (Signed)
[facility] Emergency Department Provider Note    First MD Initiated Contact with Patient 01/28/17 0211     (approximate)  I have reviewed the triage vital signs and the nursing notes.   HISTORY  Chief Complaint Shoulder Injury   HPI William Ochoa is a 56 y.o. male with below list of chronic medical conditions presents to the emergency department with right shoulder pain which occurred while pulling a object at work tonight. Patient states current pain score is 7 out of 10 worse with any movement of the right shoulder.   Past Medical History:  Diagnosis Date  . Glaucoma 2015  . Hyperlipidemia   . Hypothyroidism   . Thyroid disease     Patient Active Problem List   Diagnosis Date Noted  . Perirectal abscess 10/01/2015    Past Surgical History:  Procedure Laterality Date  . EYE SURGERY Left 2016   CORNEAL SURGERY  . FISTULOTOMY N/A 10/01/2015   Procedure: FISTULOTOMY;  Surgeon: Robert Bellow, MD;  Location: [facility];  Service: General;  Laterality: N/A;  . [place]  . INCISION AND DRAINAGE PERIRECTAL ABSCESS N/A 10/01/2015   Procedure: IRRIGATION AND DEBRIDEMENT PERIRECTAL ABSCESS;  Surgeon: Robert Bellow, MD;  Location: [facility];  Service: General;  Laterality: N/A;  . SIGMOIDOSCOPY N/A 10/01/2015   Procedure: Lonell Face;  Surgeon: Robert Bellow, MD;  Location: [facility];  Service: General;  Laterality: N/A;    Prior to Admission medications   Medication Sig Start Date End Date Taking? Authorizing Provider  docusate sodium (COLACE) 100 MG capsule Take 100 mg by mouth daily.    [provider]  levothyroxine (SYNTHROID, LEVOTHROID) 125 MCG tablet Take 125 mcg by mouth daily before breakfast.  09/07/15   [provider]  lidocaine (LIDODERM) 5 % Place 1 patch onto the skin daily. Remove & Discard patch within 12 hours or as directed by MD 01/28/17 02/07/17  Gregor Hams, MD  Multiple  Vitamins-Minerals (CENTRUM ADULTS PO) Take by mouth.    [provider]  naproxen sodium (ANAPROX) 220 MG tablet Take 220 mg by mouth as needed. Reported on 10/01/2015    [provider]  simvastatin (ZOCOR) 20 MG tablet Take 20 mg by mouth daily at 6 PM.  09/20/15   [provider]  traMADol (ULTRAM) 50 MG tablet Take 1 tablet (50 mg total) by mouth every 6 (six) hours as needed. 01/28/17 01/28/18  Gregor Hams, MD    Allergies no known drug allergies  Family History  Problem Relation Age of Onset  . Cancer Mother        skin  . Cancer Father        Prostate    Social History Social History  Substance Use Topics  . Smoking status: Current Every Day Smoker    Packs/day: 1.50    Years: 30.00    Types: Cigarettes  . Smokeless tobacco: Never Used  . Alcohol use No     Comment: SOBER FOR 23 YEARS    Review of Systems Constitutional: No fever/chills Eyes: No visual changes. ENT: No sore throat. Cardiovascular: Denies chest pain. Respiratory: Denies shortness of breath. Gastrointestinal: No abdominal pain.  No nausea, no vomiting.  No diarrhea.  No constipation. Genitourinary: Negative for dysuria. Musculoskeletal: Negative for neck pain.  Negative for back pain.positive for right shoulder pain Integumentary: Negative for rash. Neurological: Negative for headaches, focal weakness or numbness.   ____________________________________________   PHYSICAL  EXAM:  VITAL SIGNS: ED Triage Vitals  Enc Vitals Group     BP 01/28/17 0129 129/70     Pulse Rate 01/28/17 0129 72     Resp 01/28/17 0129 20     Temp 01/28/17 0129 98.2 F (36.8 C)     Temp Source 01/28/17 0129 Oral     SpO2 01/28/17 0129 97 %     Weight 01/28/17 0129 78 kg (172 lb)     Height 01/28/17 0129 1.727 m (5\' 8" )     Head Circumference --      Peak Flow --      Pain Score 01/28/17 0128 4     Pain Loc --      Pain Edu? --      Excl. in [place]? --     Constitutional: Alert and  oriented. Well appearing and in no acute distress. Eyes: Conjunctivae are normal.  Head: Atraumatic. Mouth/Throat: Mucous membranes are moist.  Oropharynx non-erythematous. Neck: No stridor.  No cervical spine tenderness to palpation. Cardiovascular: Normal rate, regular rhythm. Good peripheral circulation. Grossly normal heart sounds. Respiratory: Normal respiratory effort.  No retractions. Lungs CTAB. Gastrointestinal: Soft and nontender. No distention.  Musculoskeletal: pain to palpation anterior and lateral aspects of the right shoulder. Positive drop arm test. Pain with active and passive range of motion of the right shoulder. Neurologic:  Normal speech and language. No gross focal neurologic deficits are appreciated.  Skin:  Skin is warm, dry and intact. No rash noted. Psychiatric: Mood and affect are normal. Speech and behavior are normal.   RADIOLOGY I, [place], personally viewed and evaluated these images (plain radiographs) as part of my medical decision making, as well as reviewing the written report by the radiologist.  CLINICAL DATA:  RIGHT shoulder pain while pulling on bar at work today.  EXAM: RIGHT SHOULDER - 2+ VIEW  COMPARISON:  None.  FINDINGS: The humeral head is well-formed and located. The subacromial, glenohumeral and acromioclavicular joint spaces are intact. No destructive bony lesions. Soft tissue planes are non-suspicious.  IMPRESSION: Negative.   Electronically Signed   By: Elon Alas M.D.   On: 01/28/2017 01:50  Procedures   ____________________________________________   INITIAL IMPRESSION / ASSESSMENT AND PLAN / ED COURSE  As part of my medical decision making, I reviewed the following data within the electronic MEDICAL RECORD [id number] year old male presenting with above stated history of physical exam consistent with possible right shoulder strain versus rotator cuff injury. Patient given a Percocet in the emergency  department will be prescribed tramadol and Lidoderm patch for home. Patient is advised not to drive or operate machinery while taking tramadol. ____________________________________________  FINAL CLINICAL IMPRESSION(S) / ED DIAGNOSES  Final diagnoses:  Right shoulder injury, initial encounter     MEDICATIONS GIVEN DURING THIS VISIT:  Medications  oxyCODONE-acetaminophen (PERCOCET/ROXICET) 5-325 MG per tablet 1 tablet (1 tablet Oral Given 01/28/17 0356)     NEW OUTPATIENT MEDICATIONS STARTED DURING THIS VISIT:  Discharge Medication List as of 01/28/2017  3:46 AM    START taking these medications   Details  lidocaine (LIDODERM) 5 % Place 1 patch onto the skin daily. Remove & Discard patch within 12 hours or as directed by MD, Starting Thu 01/28/2017, Until Sun 02/07/2017, Print    traMADol (ULTRAM) 50 MG tablet Take 1 tablet (50 mg total) by mouth every 6 (six) hours as needed., Starting Thu 01/28/2017, Until Fri 01/28/2018, Print        Discharge Medication List as  of 01/28/2017  3:46 AM      Discharge Medication List as of 01/28/2017  3:46 AM       Note:  This document was prepared using Dragon voice recognition software and may include unintentional dictation errors.    Gregor Hams, MD 02/02/17 [phone]

## 2017-05-26 ENCOUNTER — Ambulatory Visit: Payer: Self-pay | Admitting: Orthopedic Surgery

## 2017-06-14 ENCOUNTER — Other Ambulatory Visit: Payer: Self-pay

## 2017-06-14 ENCOUNTER — Encounter
Admission: RE | Admit: 2017-06-14 | Discharge: 2017-06-14 | Disposition: A | Discharge status: Home / Self Care | Payer: BLUE CROSS/BLUE SHIELD | Source: Ambulatory Visit | Attending: Orthopedic Surgery | Admitting: Orthopedic Surgery

## 2017-06-14 HISTORY — DX: Inflammatory liver disease, unspecified: K75.9

## 2017-06-14 NOTE — Patient Instructions (Signed)
Your procedure is scheduled on: 06-21-17 Report to Same Day Surgery 2nd floor medical mall [facility] Entrance-take elevator on left to 2nd floor.  Check in with surgery information desk.) To find out your arrival time please call [phone] between 1PM - 3PM on 06-18-17  Remember: Instructions that are not followed completely may result in serious medical risk, up to and including death, or upon the discretion of your surgeon and anesthesiologist your surgery may need to be rescheduled.    _x___ 1. Do not eat food after midnight the night before your procedure. NO GUM OR CANDY AFTER MIDNIGHT.  You may drink clear liquids up to 2 hours before you are scheduled to arrive at the hospital for your procedure.  Do not drink clear liquids within 2 hours of your scheduled arrival to the hospital.  Clear liquids include  --Water or Apple juice without pulp  --Clear carbohydrate beverage such as ClearFast or Gatorade  --Black Coffee or Clear Tea (No milk, no creamers, do not add anything to  the coffee or Tea       __x__ 2. No Alcohol for 24 hours before or after surgery.   __x__3. No Smoking or e-cigarettes for 24 prior to surgery.  Do not use any chewable tobacco products for at least 6 hour prior to surgery   ____  4. Bring all medications with you on the day of surgery if instructed.    __x__ 5. Notify your doctor if there is any change in your medical condition     (cold, fever, infections).    x___6. On the morning of surgery brush your teeth with toothpaste and water.  You may rinse your mouth with mouth wash if you wish.  Do not swallow any toothpaste or mouthwash.   Do not wear jewelry, make-up, hairpins, clips or nail polish.  Do not wear lotions, powders, or perfumes. You may wear deodorant.  Do not shave 48 hours prior to surgery. Men may shave face and neck.  Do not bring valuables to the hospital.    [facility] is not responsible for any belongings or valuables.        Contacts, dentures or bridgework may not be worn into surgery.  Leave your suitcase in the car. After surgery it may be brought to your room.  For patients admitted to the hospital, discharge time is determined by your  treatment team.  _  Patients discharged the day of surgery will not be allowed to drive home.  You will need someone to drive you home and stay with you the night of your procedure.    Please read over the following fact sheets that you were given:   [facility] Preparing for Surgery and or MRSA Information   _x___ TAKE THE FOLLOWING MEDICATION THE MORNING OF SURGERY WITH A SMALL SIP OF WATER. These include:  1. LEVOTHYROXINE  2.  3.  4.  5.  6.  ____Fleets enema or Magnesium Citrate as directed.   _x___ Use CHG Soap or sage wipes as directed on instruction sheet   ____ Use inhalers on the day of surgery and bring to hospital day of surgery  ____ Stop Metformin and Janumet 2 days prior to surgery.    ____ Take 1/2 of usual insulin dose the night before surgery and none on the morning surgery.   ____ Follow recommendations from Cardiologist, Pulmonologist or PCP regarding stopping Aspirin, Coumadin, Plavix ,Eliquis, Effient, or Pradaxa, and Pletal.  X____Stop Anti-inflammatories such as  Advil, Aleve, Ibuprofen, Motrin, Naproxen, Naprosyn, Goodies powders or aspirin products 5 DAYS PRIOR TO SURGERY- OK to take Tylenol OR TRAMADOL IF NEEDED   ____ Stop supplements until after surgery.     ____ Bring C-Pap to the hospital.

## 2017-06-18 NOTE — Pre-Procedure Instructions (Signed)
MEDICAL CLEARANCE ON CHART FROM PCP-LOW RISK 

## 2017-06-20 MED ORDER — CEFAZOLIN SODIUM-DEXTROSE 2-4 GM/100ML-% IV SOLN
2.0000 g | INTRAVENOUS | Status: AC
  Administered 2017-06-21: 2 g via INTRAVENOUS

## 2017-06-21 ENCOUNTER — Encounter: Payer: Self-pay | Admitting: Certified Registered Nurse Anesthetist

## 2017-06-21 ENCOUNTER — Encounter
Admission: RE | Disposition: A | Discharge status: Home / Self Care | Payer: Self-pay | Source: Ambulatory Visit | Attending: Orthopedic Surgery

## 2017-06-21 ENCOUNTER — Ambulatory Visit: Payer: Worker's Compensation | Admitting: Anesthesiology

## 2017-06-21 ENCOUNTER — Ambulatory Visit
Admission: RE | Admit: 2017-06-21 | Discharge: 2017-06-21 | Disposition: A | Discharge status: Home / Self Care | Payer: Worker's Compensation | Source: Ambulatory Visit | Attending: Orthopedic Surgery | Admitting: Orthopedic Surgery

## 2017-06-21 DIAGNOSIS — F17210 Nicotine dependence, cigarettes, uncomplicated: Secondary | ICD-10-CM | POA: Diagnosis not present

## 2017-06-21 DIAGNOSIS — K759 Inflammatory liver disease, unspecified: Secondary | ICD-10-CM | POA: Insufficient documentation

## 2017-06-21 DIAGNOSIS — Z79899 Other long term (current) drug therapy: Secondary | ICD-10-CM | POA: Diagnosis not present

## 2017-06-21 DIAGNOSIS — E039 Hypothyroidism, unspecified: Secondary | ICD-10-CM | POA: Insufficient documentation

## 2017-06-21 DIAGNOSIS — M75121 Complete rotator cuff tear or rupture of right shoulder, not specified as traumatic: Secondary | ICD-10-CM | POA: Diagnosis present

## 2017-06-21 DIAGNOSIS — M66811 Spontaneous rupture of other tendons, right shoulder: Secondary | ICD-10-CM | POA: Insufficient documentation

## 2017-06-21 DIAGNOSIS — M7551 Bursitis of right shoulder: Secondary | ICD-10-CM | POA: Diagnosis not present

## 2017-06-21 HISTORY — PX: ARTHOSCOPIC ROTAOR CUFF REPAIR: SHX5002

## 2017-06-21 HISTORY — PX: SUBACROMIAL DECOMPRESSION: SHX5174

## 2017-06-21 HISTORY — PX: SHOULDER ARTHROSCOPY WITH BICEPSTENOTOMY: SHX6204

## 2017-06-21 HISTORY — PX: RESECTION DISTAL CLAVICAL: SHX5053

## 2017-06-21 HISTORY — PX: SHOULDER ARTHROSCOPY WITH OPEN ROTATOR CUFF REPAIR: SHX6092

## 2017-06-21 LAB — URINE DRUG SCREEN, QUALITATIVE (ARMC ONLY)
Amphetamines, Ur Screen: NOT DETECTED
BARBITURATES, UR SCREEN: NOT DETECTED
Benzodiazepine, Ur Scrn: NOT DETECTED
CANNABINOID 50 NG, UR ~~LOC~~: NOT DETECTED
Cocaine Metabolite,Ur ~~LOC~~: NOT DETECTED
MDMA (Ecstasy)Ur Screen: NOT DETECTED
Methadone Scn, Ur: NOT DETECTED
OPIATE, UR SCREEN: NOT DETECTED
PHENCYCLIDINE (PCP) UR S: NOT DETECTED
Tricyclic, Ur Screen: NOT DETECTED

## 2017-06-21 SURGERY — ARTHROSCOPY, SHOULDER WITH REPAIR, ROTATOR CUFF, OPEN
Anesthesia: General | Laterality: Right

## 2017-06-21 MED ORDER — GABAPENTIN 300 MG PO CAPS
ORAL_CAPSULE | ORAL | Status: AC
  Filled 2017-06-21: qty 1

## 2017-06-21 MED ORDER — MEPERIDINE HCL 50 MG/ML IJ SOLN: 6.2500 mg | INTRAMUSCULAR | Status: DC | PRN

## 2017-06-21 MED ORDER — BUPIVACAINE HCL (PF) 0.5 % IJ SOLN
INTRAMUSCULAR | Status: AC
  Filled 2017-06-21: qty 10

## 2017-06-21 MED ORDER — GABAPENTIN 300 MG PO CAPS
300.0000 mg | ORAL_CAPSULE | Freq: Once | ORAL | Status: AC
  Administered 2017-06-21: 300 mg via ORAL

## 2017-06-21 MED ORDER — EPINEPHRINE 30 MG/30ML IJ SOLN
INTRAMUSCULAR | Status: DC | PRN
  Administered 2017-06-21: 5 mL

## 2017-06-21 MED ORDER — ROCURONIUM BROMIDE 100 MG/10ML IV SOLN
INTRAVENOUS | Status: DC | PRN
  Administered 2017-06-21: 50 mg via INTRAVENOUS

## 2017-06-21 MED ORDER — OXYCODONE HCL 5 MG PO TABS: 5.0000 mg | ORAL_TABLET | Freq: Once | ORAL | Status: DC | PRN

## 2017-06-21 MED ORDER — BUPIVACAINE HCL (PF) 0.5 % IJ SOLN
INTRAMUSCULAR | Status: DC | PRN
  Administered 2017-06-21: 10 mL via PERINEURAL

## 2017-06-21 MED ORDER — OXYCODONE HCL 5 MG/5ML PO SOLN: 5.0000 mg | Freq: Once | ORAL | Status: DC | PRN

## 2017-06-21 MED ORDER — EPHEDRINE SULFATE 50 MG/ML IJ SOLN
INTRAMUSCULAR | Status: DC | PRN
  Administered 2017-06-21 (×4): 10 mg via INTRAVENOUS

## 2017-06-21 MED ORDER — MIDAZOLAM HCL 2 MG/2ML IJ SOLN
INTRAMUSCULAR | Status: AC
  Filled 2017-06-21: qty 2

## 2017-06-21 MED ORDER — FAMOTIDINE 20 MG PO TABS
20.0000 mg | ORAL_TABLET | Freq: Once | ORAL | Status: AC
  Administered 2017-06-21: 20 mg via ORAL

## 2017-06-21 MED ORDER — BUPIVACAINE LIPOSOME 1.3 % IJ SUSP
INTRAMUSCULAR | Status: AC
  Filled 2017-06-21: qty 20

## 2017-06-21 MED ORDER — CEFAZOLIN SODIUM-DEXTROSE 2-4 GM/100ML-% IV SOLN
INTRAVENOUS | Status: AC
  Filled 2017-06-21: qty 100

## 2017-06-21 MED ORDER — ACETAMINOPHEN 500 MG PO TABS
ORAL_TABLET | ORAL | Status: AC
  Administered 2017-06-21: 1000 mg via ORAL
  Filled 2017-06-21: qty 2

## 2017-06-21 MED ORDER — FENTANYL CITRATE (PF) 100 MCG/2ML IJ SOLN
50.0000 ug | Freq: Once | INTRAMUSCULAR | Status: AC
  Administered 2017-06-21: 50 ug via INTRAVENOUS

## 2017-06-21 MED ORDER — MIDAZOLAM HCL 2 MG/2ML IJ SOLN
INTRAMUSCULAR | Status: DC | PRN
  Administered 2017-06-21: 2 mg via INTRAVENOUS

## 2017-06-21 MED ORDER — BUPIVACAINE-EPINEPHRINE (PF) 0.5% -1:200000 IJ SOLN
INTRAMUSCULAR | Status: AC
  Filled 2017-06-21: qty 30

## 2017-06-21 MED ORDER — HYDROCODONE-ACETAMINOPHEN 5-325 MG PO TABS: 1.0000 | ORAL_TABLET | ORAL | Status: DC | PRN

## 2017-06-21 MED ORDER — SODIUM CHLORIDE 0.9 % IJ SOLN
INTRAMUSCULAR | Status: AC
  Filled 2017-06-21: qty 10

## 2017-06-21 MED ORDER — DEXAMETHASONE SODIUM PHOSPHATE 10 MG/ML IJ SOLN
INTRAMUSCULAR | Status: AC
  Filled 2017-06-21: qty 1

## 2017-06-21 MED ORDER — ONDANSETRON HCL 4 MG/2ML IJ SOLN: 4.0000 mg | Freq: Four times a day (QID) | INTRAMUSCULAR | Status: DC | PRN

## 2017-06-21 MED ORDER — LACTATED RINGERS IV SOLN
INTRAVENOUS | Status: DC
  Administered 2017-06-21: 09:00:00 via INTRAVENOUS

## 2017-06-21 MED ORDER — DEXAMETHASONE SODIUM PHOSPHATE 10 MG/ML IJ SOLN
INTRAMUSCULAR | Status: DC | PRN
  Administered 2017-06-21: 10 mg via INTRAVENOUS

## 2017-06-21 MED ORDER — CHLORHEXIDINE GLUCONATE 4 % EX LIQD: 60.0000 mL | Freq: Once | CUTANEOUS | Status: DC

## 2017-06-21 MED ORDER — LIDOCAINE HCL (PF) 1 % IJ SOLN
INTRAMUSCULAR | Status: AC
  Filled 2017-06-21: qty 5

## 2017-06-21 MED ORDER — EPINEPHRINE 30 MG/30ML IJ SOLN
INTRAMUSCULAR | Status: AC
  Filled 2017-06-21: qty 1

## 2017-06-21 MED ORDER — FENTANYL CITRATE (PF) 100 MCG/2ML IJ SOLN
INTRAMUSCULAR | Status: AC
  Administered 2017-06-21: 50 ug via INTRAVENOUS
  Filled 2017-06-21: qty 2

## 2017-06-21 MED ORDER — SODIUM CHLORIDE 0.9 % IJ SOLN
INTRAMUSCULAR | Status: AC
  Filled 2017-06-21: qty 50

## 2017-06-21 MED ORDER — EPINEPHRINE PF 1 MG/ML IJ SOLN
INTRAMUSCULAR | Status: AC
  Filled 2017-06-21: qty 1

## 2017-06-21 MED ORDER — DOCUSATE SODIUM 100 MG PO CAPS: 100.0000 mg | ORAL_CAPSULE | Freq: Every day | ORAL | 2 refills | Status: DC | PRN

## 2017-06-21 MED ORDER — LIDOCAINE HCL (CARDIAC) 20 MG/ML IV SOLN
INTRAVENOUS | Status: DC | PRN
  Administered 2017-06-21: 80 mg via INTRAVENOUS

## 2017-06-21 MED ORDER — OXYCODONE-ACETAMINOPHEN 10-325 MG PO TABS: 1.0000 | ORAL_TABLET | Freq: Four times a day (QID) | ORAL | 0 refills | Status: DC | PRN

## 2017-06-21 MED ORDER — FAMOTIDINE 20 MG PO TABS
ORAL_TABLET | ORAL | Status: AC
  Administered 2017-06-21: 20 mg via ORAL
  Filled 2017-06-21: qty 1

## 2017-06-21 MED ORDER — METOCLOPRAMIDE HCL 10 MG PO TABS: 5.0000 mg | ORAL_TABLET | Freq: Three times a day (TID) | ORAL | Status: DC | PRN

## 2017-06-21 MED ORDER — PROPOFOL 10 MG/ML IV BOLUS
INTRAVENOUS | Status: AC
  Filled 2017-06-21: qty 40

## 2017-06-21 MED ORDER — MIDAZOLAM HCL 2 MG/2ML IJ SOLN
1.0000 mg | Freq: Once | INTRAMUSCULAR | Status: AC
  Administered 2017-06-21: 1 mg via INTRAVENOUS

## 2017-06-21 MED ORDER — SUCCINYLCHOLINE CHLORIDE 20 MG/ML IJ SOLN
INTRAMUSCULAR | Status: AC
  Filled 2017-06-21: qty 1

## 2017-06-21 MED ORDER — HYDROMORPHONE HCL 1 MG/ML IJ SOLN: 0.5000 mg | INTRAMUSCULAR | Status: DC | PRN

## 2017-06-21 MED ORDER — ACETAMINOPHEN 500 MG PO TABS
1000.0000 mg | ORAL_TABLET | Freq: Once | ORAL | Status: AC
  Administered 2017-06-21: 1000 mg via ORAL

## 2017-06-21 MED ORDER — ONDANSETRON HCL 4 MG/2ML IJ SOLN
INTRAMUSCULAR | Status: AC
  Filled 2017-06-21: qty 2

## 2017-06-21 MED ORDER — LACTATED RINGERS IV SOLN: INTRAVENOUS | Status: DC

## 2017-06-21 MED ORDER — HYDROMORPHONE HCL 1 MG/ML IJ SOLN: 0.2500 mg | INTRAMUSCULAR | Status: DC | PRN

## 2017-06-21 MED ORDER — ONDANSETRON HCL 4 MG/2ML IJ SOLN
INTRAMUSCULAR | Status: DC | PRN
  Administered 2017-06-21: 4 mg via INTRAVENOUS

## 2017-06-21 MED ORDER — OXYCODONE-ACETAMINOPHEN 5-325 MG PO TABS: 1.0000 | ORAL_TABLET | ORAL | Status: DC | PRN

## 2017-06-21 MED ORDER — PHENYLEPHRINE HCL 10 MG/ML IJ SOLN
INTRAMUSCULAR | Status: DC | PRN
  Administered 2017-06-21: 100 ug via INTRAVENOUS
  Administered 2017-06-21: 50 ug via INTRAVENOUS

## 2017-06-21 MED ORDER — EPHEDRINE SULFATE 50 MG/ML IJ SOLN
INTRAMUSCULAR | Status: AC
  Filled 2017-06-21: qty 1

## 2017-06-21 MED ORDER — ROPIVACAINE HCL 5 MG/ML IJ SOLN
INTRAMUSCULAR | Status: AC
  Filled 2017-06-21: qty 20

## 2017-06-21 MED ORDER — MIDAZOLAM HCL 2 MG/2ML IJ SOLN
INTRAMUSCULAR | Status: AC
  Administered 2017-06-21: 1 mg via INTRAVENOUS
  Filled 2017-06-21: qty 2

## 2017-06-21 MED ORDER — ROCURONIUM BROMIDE 50 MG/5ML IV SOLN
INTRAVENOUS | Status: AC
  Filled 2017-06-21: qty 1

## 2017-06-21 MED ORDER — BUPIVACAINE LIPOSOME 1.3 % IJ SUSP
INTRAMUSCULAR | Status: DC | PRN
  Administered 2017-06-21: 20 mL via PERINEURAL

## 2017-06-21 MED ORDER — ONDANSETRON HCL 4 MG PO TABS: 4.0000 mg | ORAL_TABLET | Freq: Four times a day (QID) | ORAL | Status: DC | PRN

## 2017-06-21 MED ORDER — PROPOFOL 10 MG/ML IV BOLUS
INTRAVENOUS | Status: DC | PRN
  Administered 2017-06-21: 170 mg via INTRAVENOUS

## 2017-06-21 MED ORDER — METOCLOPRAMIDE HCL 5 MG/ML IJ SOLN: 5.0000 mg | Freq: Three times a day (TID) | INTRAMUSCULAR | Status: DC | PRN

## 2017-06-21 MED ORDER — FENTANYL CITRATE (PF) 100 MCG/2ML IJ SOLN
INTRAMUSCULAR | Status: AC
  Filled 2017-06-21: qty 2

## 2017-06-21 MED ORDER — PROMETHAZINE HCL 25 MG/ML IJ SOLN: 6.2500 mg | INTRAMUSCULAR | Status: DC | PRN

## 2017-06-21 MED ORDER — FENTANYL CITRATE (PF) 100 MCG/2ML IJ SOLN
INTRAMUSCULAR | Status: DC | PRN
  Administered 2017-06-21 (×2): 50 ug via INTRAVENOUS

## 2017-06-21 SURGICAL SUPPLY — 66 items
ADAPTER IRRIG TUBE 2 SPIKE SOL (ADAPTER) ×8 IMPLANT
ANCHOR SUT BIO SW 4.75X19.1 (Anchor) ×4 IMPLANT
BLADE FULL RADIUS 3.5 (BLADE) ×4 IMPLANT
BLADE INCISOR PLUS 4.5 (BLADE) ×4 IMPLANT
BLADE SURG MINI STRL (BLADE) ×4 IMPLANT
BRUSH SCRUB EZ  4% CHG (MISCELLANEOUS) ×2
BRUSH SCRUB EZ 4% CHG (MISCELLANEOUS) ×2 IMPLANT
BUR BR 5.5 WIDE MOUTH (BURR) IMPLANT
CANNULA 5.75X7 CRYSTAL CLEAR (CANNULA) ×4 IMPLANT
CANNULA PARTIAL THREAD 2X7 (CANNULA) IMPLANT
CANNULA TWIST IN 8.25X9CM (CANNULA) ×8 IMPLANT
CHLORAPREP W/TINT 26ML (MISCELLANEOUS) ×4 IMPLANT
CLOSURE WOUND 1/4X4 (GAUZE/BANDAGES/DRESSINGS)
COOLER POLAR GLACIER W/PUMP (MISCELLANEOUS) ×4 IMPLANT
CRADLE LAMINECT ARM (MISCELLANEOUS) ×4 IMPLANT
DEVICE SUCT BLK HOLE OR FLOOR (MISCELLANEOUS) ×4 IMPLANT
DRAPE IMP U-DRAPE 54X76 (DRAPES) ×8 IMPLANT
DRAPE INCISE IOBAN 66X45 STRL (DRAPES) ×4 IMPLANT
DRAPE SHEET LG 3/4 BI-LAMINATE (DRAPES) ×4 IMPLANT
DRAPE STERI 35X30 U-POUCH (DRAPES) ×4 IMPLANT
DRAPE U-SHAPE 47X51 STRL (DRAPES) ×4 IMPLANT
ELECT REM PT RETURN 9FT ADLT (ELECTROSURGICAL) ×4
ELECTRODE REM PT RTRN 9FT ADLT (ELECTROSURGICAL) ×2 IMPLANT
GAUZE PETRO XEROFOAM 1X8 (MISCELLANEOUS) ×4 IMPLANT
GAUZE SPONGE 4X4 12PLY STRL (GAUZE/BANDAGES/DRESSINGS) ×4 IMPLANT
GLOVE BIOGEL PI IND STRL 8 (GLOVE) ×2 IMPLANT
GLOVE BIOGEL PI INDICATOR 8 (GLOVE) ×2
GLOVE SURG ORTHO 8.0 STRL STRW (GLOVE) ×4 IMPLANT
GOWN STRL REUS W/ TWL LRG LVL3 (GOWN DISPOSABLE) ×2 IMPLANT
GOWN STRL REUS W/ TWL XL LVL3 (GOWN DISPOSABLE) ×2 IMPLANT
GOWN STRL REUS W/TWL LRG LVL3 (GOWN DISPOSABLE) ×2
GOWN STRL REUS W/TWL XL LVL3 (GOWN DISPOSABLE) ×2
IV LACTATED RINGER IRRG 3000ML (IV SOLUTION) ×12
IV LR IRRIG 3000ML ARTHROMATIC (IV SOLUTION) ×12 IMPLANT
KIT STABILIZATION SHOULDER (MISCELLANEOUS) ×4 IMPLANT
KIT TURNOVER KIT A (KITS) ×4 IMPLANT
MANIFOLD NEPTUNE II (INSTRUMENTS) ×8 IMPLANT
MASK FACE SPIDER DISP (MASK) ×4 IMPLANT
MAT BLUE FLOOR 46X72 FLO (MISCELLANEOUS) ×4 IMPLANT
NDL SAFETY ECLIPSE 18X1.5 (NEEDLE) ×2 IMPLANT
NEEDLE HYPO 18GX1.5 SHARP (NEEDLE) ×2
NEEDLE HYPO 22GX1.5 SAFETY (NEEDLE) ×4 IMPLANT
NEEDLE SCORPION MULTI FIRE (NEEDLE) ×4 IMPLANT
NEEDLE SPNL 18GX3.5 QUINCKE PK (NEEDLE) ×4 IMPLANT
PACK ARTHROSCOPY SHOULDER (MISCELLANEOUS) ×4 IMPLANT
PAD ABD DERMACEA PRESS 5X9 (GAUZE/BANDAGES/DRESSINGS) IMPLANT
PAD WRAPON POLAR SHDR XLG (MISCELLANEOUS) ×2 IMPLANT
SET TUBE SUCT SHAVER OUTFL 24K (TUBING) IMPLANT
SET TUBE TIP INTRA-ARTICULAR (MISCELLANEOUS) IMPLANT
SLING ARM LRG DEEP (SOFTGOODS) IMPLANT
SPONGE XRAY 4X4 16PLY STRL (MISCELLANEOUS) IMPLANT
STRAP SAFETY 5IN WIDE (MISCELLANEOUS) ×4 IMPLANT
STRIP CLOSURE SKIN 1/4X4 (GAUZE/BANDAGES/DRESSINGS) IMPLANT
SUT ETHILON NAB PS2 4-0 18IN (SUTURE) ×4 IMPLANT
SUT FIBERWIRE #2 38 T-5 BLUE (SUTURE)
SUT PDS AB 0 CT1 27 (SUTURE) ×4 IMPLANT
SUT TIGER TAPE 7 IN WHITE (SUTURE) IMPLANT
SUTURE FIBERWR #2 38 T-5 BLUE (SUTURE) IMPLANT
SYR 10ML LL (SYRINGE) ×4 IMPLANT
SYR 50ML LL SCALE MARK (SYRINGE) ×4 IMPLANT
TAPE MICROFOAM 4IN (TAPE) ×4 IMPLANT
TUBING ARTHRO INFLOW-ONLY STRL (TUBING) ×4 IMPLANT
TUBING CONNECTING 10 (TUBING) ×3 IMPLANT
TUBING CONNECTING 10' (TUBING) ×1
WAND HAND CNTRL MULTIVAC 90 (MISCELLANEOUS) ×4 IMPLANT
WRAPON POLAR PAD SHDR XLG (MISCELLANEOUS) ×4

## 2017-06-21 NOTE — Anesthesia Post-op Follow-up Note (Signed)
Anesthesia QCDR form completed.        

## 2017-06-21 NOTE — H&P (Signed)
The patient has been re-examined, and the chart reviewed, and there have been no interval changes to the documented history and physical.  Plan a right shoulder arthroscopic rotator cuff repair, biceps tenotomy, decompression and distal clavicle excision today.  Anesthesia is consulted regarding a peripheral nerve block for post-operative pain.  The risks, benefits, and alternatives have been discussed at length, and the patient is willing to proceed.

## 2017-06-21 NOTE — Op Note (Signed)
06/21/2017  1:02 PM  PATIENT:  William Ochoa  57 y.o. 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    The risks benefits and alternatives were discussed with the patient preoperatively including but not limited to the risks of infection, bleeding, nerve injury, persistent pain or weakness, failure of the hardware, re-tear of the rotator cuff and the need for further surgery. Medical risks include DVT and pulmonary embolism, myocardial infarction, stroke, pneumonia, respiratory failure and death. Patient understood these risks and wished to proceed.  OPERATIVE IMPLANTS: [employer] SpeedBridge System  OPERATIVE PROCEDURE: The patient was met in the preoperative area. The right shoulder was signed with my initials according the hospital's correct site of surgery protocol. The patient is brought to the OR and underwent a supraclavicular block and general endotracheal intubation by the anesthesia service.  The patient was placed in a beachchair position. A spider arm positioner was used for this case. Examination under anesthesia revealed no instability anterior or posterior.  The patient was prepped and draped in a sterile fashion. A timeout was performed to verify the patient's name, date of birth, medical record number, correct site of  surgery and correct procedure to be performed there was also used to verify the patient received antibiotics that all appropriate instruments, implants and radiographs studies were available in the room. Once all in attendance were in agreement case began.  Bony landmarks were drawn out with a surgical marker along with proposed arthroscopy incisions. These were pre-injected with 1% lidocaine plain. An 11 blade was used to establish a posterior portal through which the arthroscope was placed in the glenohumeral joint. A full diagnostic examination of the shoulder was performed. The anterior portal was established under direct visualization with an 18-gauge spinal needle.  A 5.75 mm arthroscopic cannula was placed through the anterior portal.   The intra-articular portion of the biceps tendon was found to have a partial tear involving greater than 50% of the diameter. Therefore the decision was made to perform a tenotomy. An arthroscopic wand was used to release the biceps tendon off the superior labrum. The arthroscopic shaver was then used to debride the frayed edges of the labrum. There were no anterior or superior labral tears seen.  Subscapularis tendon was intact. Patient had a near full-thickness tear involving the supraspinatus with minimal retraction. There were no loose bodies within the inferior recess and no evidence of HAGL lesion.  The arthroscope was then placed in the subacromial space. A lateral portal was then established using an 18-gauge spinal needle for localization.   The greater tuberosity was debrided using a 5.5 mm resector shaver blade to remove all remaining foreign fibers of the rotator cuff.  Debridement was performed until punctate bleeding was seen at the greater tuberosity footprint, which will allow for rotator cuff healing.  Extensive bursitis was encountered and debrided using a 4-0 resector shaver blade and a 90 ArthroCare wand from the  lateral portal. Using the SpeedFix  system fiber tape was placed through the cuff. The tape was then fixated on the lateral side with a SwiveLock anchor. The final construct was stable and moved as a unit with excellent coverage of the humeral head.  Using the arthroscopic, hooded burr a distal clavicle excision was performed. Care was taken to remove approximately 8 mm of bone. The 70 degree arthroscope was utilized from a lateral portal.  All incisions were copiously irrigated. Skin closure for the arthroscopic incisions was performed with 4-0 nylon.  A dry sterile dressing including Steri-Strips was applied . The patient was placed in an abduction sling.  All sharp and instrument counts were correct at the conclusion of the case. I was scrubbed and present for the entire case. I spoke with the patient's family in the post-op consultation room and informed them that the case had been performed without complication and the patient was stable in recovery room.   Kurtis Bushman, MD

## 2017-06-21 NOTE — Discharge Instructions (Addendum)
Interscalene Nerve Block with Exparel  1.  For your surgery you have received an Interscalene Nerve Block with Exparel. 2. Nerve Blocks affect many types of nerves, including nerves that control movement, pain and normal sensation.  You may experience feelings such as numbness, tingling, heaviness, weakness or the inability to move your arm or the feeling or sensation that your arm has "fallen asleep". 3. A nerve block with Exparel can last up to 5 days.  Usually the weakness wears off first.  The tingling and heaviness usually wear off next.  Finally you may start to notice pain.  Keep in mind that this may occur in any order.  Once a nerve block starts to wear off it is usually completely gone within 60 minutes. 4. ISNB may cause mild shortness of breath, a hoarse voice, blurry vision, unequal pupils, or drooping of the face on the same side as the nerve block.  These symptoms will usually resolve with the numbness.  Very rarely the procedure itself can cause mild seizures. 5. If needed, your surgeon will give you a prescription for pain medication.  It will take about 60 minutes for the oral pain medication to become fully effective.  So, it is recommended that you start taking this medication before the nerve block first begins to wear off, or when you first begin to feel discomfort. 6. Take your pain medication only as prescribed.  Pain medication can cause sedation and decrease your breathing if you take more than you need for the level of pain that you have. 7. Nausea is a common side effect of many pain medications.  You may want to eat something before taking your pain medicine to prevent nausea. 8. After an Interscalene nerve block, you cannot feel pain, pressure or extremes in temperature in the effected arm.  Because your arm is numb it is at an increased risk for injury.  To decrease the possibility of injury, please practice the following:  a. While you are awake change the position of  your arm frequently to prevent too much pressure on any one area for prolonged periods of time. b.  If you have a cast or tight dressing, check the color or your fingers every couple of hours.  Call your surgeon with the appearance of any discoloration (white or blue). c. If you are given a sling to wear before you go home, please wear it  at all times until the block has completely worn off.  Do not get up at night without your sling. d. Please contact [facility] or your surgeon if you do not begin to regain sensation after 7 days from the surgery.  Anesthesia may be contacted by calling the Same Day Surgery Department, Mon. through Fri., 6 am to 4 pm at [phone].   e. If you experience any other problems or concerns, please contact your surgeon's office. f. If you experience severe or prolonged shortness of breath go to the nearest emergency department.   AMBULATORY SURGERY  DISCHARGE INSTRUCTIONS   1) The drugs that you were given will stay in your system until tomorrow so for the next 24 hours you should not:  A) Drive an automobile B) Make any legal decisions C) Drink any alcoholic beverage   2) You may resume regular meals tomorrow.  Today it is better to start with liquids and gradually work up to solid foods.  You may eat anything you prefer, but it is better to start with liquids, then soup   and crackers, and gradually work up to solid foods.   3) Please notify your doctor immediately if you have any unusual bleeding, trouble breathing, redness and pain at the surgery site, drainage, fever, or pain not relieved by medication.    4) Additional Instructions:        Please contact your physician with any problems or Same Day Surgery at [phone], Monday through Friday 6 am to 4 pm, or [facility] number at [phone]. 

## 2017-06-21 NOTE — Transfer of Care (Signed)
Immediate Anesthesia Transfer of Care Note  Patient: William Ochoa  Procedure(s) Performed: SHOULDER ARTHROSCOPY (Right ) SUBACROMIAL DECOMPRESSION (Right ) ARTHROSCOPIC ROTATOR CUFF REPAIR (Right ) SHOULDER ARTHROSCOPY WITH BICEPSTENOTOMY (Right ) DISTAL CLAVICALEXCISION  Patient Location: PACU  Anesthesia Type:General  Level of Consciousness: sedated  Airway & Oxygen Therapy: Patient Spontanous Breathing  Post-op Assessment: Report given to RN  Post vital signs: stable  Last Vitals:  Vitals:   06/21/17 1007 06/21/17 1012  BP: 103/63 101/63  Pulse: 64 (!) 59  Resp: 15 19  Temp:    SpO2: 95% 95%    Last Pain:  Vitals:   06/21/17 0837  TempSrc: Oral  PainSc: 5          Complications: No apparent anesthesia complications

## 2017-06-21 NOTE — Anesthesia Procedure Notes (Signed)
Anesthesia Regional Block: Interscalene brachial plexus block   Pre-Anesthetic Checklist: ,, timeout performed, Correct Patient, Correct Site, Correct Laterality, Correct Procedure, Correct Position, site marked, Risks and benefits discussed,  Surgical consent,  Pre-op evaluation,  At surgeon's request and post-op pain management  Laterality: Right  Prep: chloraprep       Needles:  Injection technique: Single-shot  Needle Type: Echogenic Stimulator Needle     Needle Length: 10cm  Needle Gauge: 21   Needle insertion depth: 5 cm   Additional Needles:   Procedures: Doppler guided,,,, ultrasound used (permanent image in chart),,,,  Narrative:  Start time: 06/21/2017 9:34 AM End time: 06/21/2017 9:47 AM Injection made incrementally with aspirations every 5 mL.  Performed by: Personally

## 2017-06-21 NOTE — Anesthesia Postprocedure Evaluation (Signed)
Anesthesia Post Note  Patient: William Ochoa  Procedure(s) Performed: SHOULDER ARTHROSCOPY (Right ) SUBACROMIAL DECOMPRESSION (Right ) ARTHROSCOPIC ROTATOR CUFF REPAIR (Right ) SHOULDER ARTHROSCOPY WITH BICEPSTENOTOMY (Right ) DISTAL CLAVICALEXCISION  Patient location during evaluation: PACU Anesthesia Type: General and Regional Level of consciousness: awake and alert Pain management: pain level controlled Vital Signs Assessment: post-procedure vital signs reviewed and stable Respiratory status: spontaneous breathing, nonlabored ventilation and respiratory function stable Cardiovascular status: blood pressure returned to baseline and stable Postop Assessment: no apparent nausea or vomiting Anesthetic complications: no Comments: Post op block w good analgesia     Last Vitals:  Vitals:   06/21/17 1341 06/21/17 1356  BP: 119/78 109/81  Pulse: 81 74  Resp: 17 16  Temp: 36.6 C 36.6 C  SpO2: 94% 93%    Last Pain:  Vitals:   06/21/17 1356  TempSrc: Temporal  PainSc:                  Alphonsus Sias

## 2017-06-21 NOTE — Anesthesia Preprocedure Evaluation (Signed)
Anesthesia Evaluation  Patient identified by MRN, date of birth, ID band Patient awake    Reviewed: Allergy & Precautions, H&P , NPO status , reviewed documented beta blocker date and time   Airway Mallampati: II  TM Distance: >3 FB     Dental  (+) Edentulous Upper, Edentulous Lower   Pulmonary Current Smoker,    Pulmonary exam normal        Cardiovascular Normal cardiovascular exam     Neuro/Psych    GI/Hepatic neg GERD  ,(+) Hepatitis -  Endo/Other  Hypothyroidism   Renal/GU      Musculoskeletal   Abdominal   Peds  Hematology   Anesthesia Other Findings   Reproductive/Obstetrics                             Anesthesia Physical Anesthesia Plan  ASA: II  Anesthesia Plan: General ETT   Post-op Pain Management:  Regional for Post-op pain   Induction:   PONV Risk Score and Plan: 2 and Ondansetron and Midazolam  Airway Management Planned:   Additional Equipment:   Intra-op Plan:   Post-operative Plan:   Informed Consent: I have reviewed the patients History and Physical, chart, labs and discussed the procedure including the risks, benefits and alternatives for the proposed anesthesia with the patient or authorized representative who has indicated his/her understanding and acceptance.   Dental Advisory Given  Plan Discussed with: CRNA  Anesthesia Plan Comments:         Anesthesia Quick Evaluation

## 2017-06-21 NOTE — Anesthesia Procedure Notes (Signed)
Procedure Name: Intubation Date/Time: 06/21/2017 10:26 AM Performed by: Carron Curie, CRNA Pre-anesthesia Checklist: Patient identified, Emergency Drugs available, Suction available, Patient being monitored and Timeout performed Patient Re-evaluated:Patient Re-evaluated prior to induction Oxygen Delivery Method: Circle system utilized Preoxygenation: Pre-oxygenation with 100% oxygen Induction Type: IV induction Ventilation: Mask ventilation without difficulty Laryngoscope Size: Mac and 3 Grade View: Grade II Tube type: Oral Tube size: 7.5 mm Number of attempts: 1 Airway Equipment and Method: Stylet

## 2017-06-22 ENCOUNTER — Encounter: Payer: Self-pay | Admitting: Orthopedic Surgery

## 2017-06-28 DIAGNOSIS — Z9889 Other specified postprocedural states: Secondary | ICD-10-CM | POA: Insufficient documentation

## 2017-11-24 ENCOUNTER — Encounter: Payer: Self-pay | Admitting: Emergency Medicine

## 2017-11-24 ENCOUNTER — Other Ambulatory Visit: Payer: Self-pay

## 2017-11-24 ENCOUNTER — Ambulatory Visit
Admission: EM | Admit: 2017-11-24 | Discharge: 2017-11-24 | Disposition: A | Discharge status: Home / Self Care | Payer: BLUE CROSS/BLUE SHIELD | Attending: Emergency Medicine | Admitting: Emergency Medicine

## 2017-11-24 DIAGNOSIS — K611 Rectal abscess: Secondary | ICD-10-CM | POA: Diagnosis not present

## 2017-11-24 MED ORDER — SULFAMETHOXAZOLE-TRIMETHOPRIM 800-160 MG PO TABS: 2.0000 | ORAL_TABLET | Freq: Two times a day (BID) | ORAL | 0 refills | Status: AC

## 2017-11-24 MED ORDER — CLINDAMYCIN HCL 300 MG PO CAPS: 300.0000 mg | ORAL_CAPSULE | Freq: Four times a day (QID) | ORAL | 0 refills | Status: AC

## 2017-11-24 NOTE — Discharge Instructions (Addendum)
I have arranged an appointment with Dr. Bary Castilla tomorrow at his office at 145.  Please show up at 130 to get registered.  Take the Bactrim twice a day, and the clindamycin 4 times a day.  You may take 600 mg of ibuprofen 3 or 4 times a day as needed for pain.  Go immediately to the ER for fevers above 100.4, body aches, vomiting, if you have pain not controlled with ibuprofen, or for other concerns.

## 2017-11-24 NOTE — ED Provider Notes (Signed)
HPI  SUBJECTIVE:  William Ochoa is a 57 y.o. 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  He reports bleeding starting last week at night.  He reports swelling that extends to the thigh.  He denies trauma although he does ride a motorcycle.  He is also a Administrator.  No body aches, vomiting, fevers.  Patient reports pain with urination but states that he is occasionally unable to urinate but is eventually able to completely empty his bladder.  He tried boil salve with improvement in symptoms.  Symptoms are worse with sitting, walking.  No antibiotics in the past month.  No antipyretic in the past 6 to 8 hours. he has a past medical history of perirectal abscess status post fistulotomy in 2017.  No history of MRSA, diabetes, hypertension.  PMD: Dr. Florence Canner at [facility].  Past Medical History:  Diagnosis Date  . Glaucoma 2015  . Hepatitis 1970'S   A OR B-PT CANNOT REMEMBER WHICH ONE  . Hyperlipidemia   . Hypothyroidism   . Thyroid disease     Past Surgical History:  Procedure Laterality Date  . ARTHOSCOPIC ROTAOR CUFF REPAIR Right 06/21/2017   Procedure: ARTHROSCOPIC ROTATOR CUFF REPAIR;  Surgeon: Lovell Sheehan, MD;  Location: [facility] ORS;  Service: Orthopedics;  Laterality: Right;  . EYE SURGERY Left 2016   CORNEAL SURGERY  . FISTULOTOMY N/A 10/01/2015   Procedure: FISTULOTOMY;  Surgeon: Robert Bellow, MD;  Location: [facility];  Service: General;  Laterality: N/A;  . [place]  . INCISION AND DRAINAGE PERIRECTAL ABSCESS N/A 10/01/2015   Procedure: IRRIGATION AND DEBRIDEMENT PERIRECTAL ABSCESS;  Surgeon: Robert Bellow, MD;  Location: [facility];  Service: General;  Laterality: N/A;  . RESECTION DISTAL CLAVICAL  06/21/2017   Procedure: DISTAL CLAVICALEXCISION;  Surgeon: Lovell Sheehan, MD;  Location: [facility];  Service: Orthopedics;;  . SHOULDER ARTHROSCOPY WITH BICEPSTENOTOMY Right 06/21/2017   Procedure: SHOULDER ARTHROSCOPY WITH BICEPSTENOTOMY;  Surgeon: Lovell Sheehan, MD;  Location: [facility];  Service: Orthopedics;  Laterality: Right;  . SHOULDER ARTHROSCOPY WITH OPEN ROTATOR CUFF REPAIR Right 06/21/2017   Procedure: SHOULDER ARTHROSCOPY;  Surgeon: Lovell Sheehan, MD;  Location: [facility];  Service: Orthopedics;  Laterality: Right;  . SIGMOIDOSCOPY N/A 10/01/2015   Procedure: SIGMOIDOSCOPY;  Surgeon: Robert Bellow, MD;  Location: [facility];  Service: General;  Laterality: N/A;  . SUBACROMIAL DECOMPRESSION Right 06/21/2017   Procedure: SUBACROMIAL DECOMPRESSION;  Surgeon: Lovell Sheehan, MD;  Location: [facility];  Service: Orthopedics;  Laterality: Right;    Family History  Problem Relation Age of Onset  . Cancer Mother        skin  . Cancer Father        Prostate    Social History   Tobacco Use  . Smoking status: Current Every Day Smoker    Packs/day: 1.00    Years: 30.00    Pack years: 30.00    Types: Cigarettes  . Smokeless tobacco: Never Used  Substance Use Topics  . Alcohol use: No    Alcohol/week: 0.0 standard drinks    Comment: SOBER FOR 24 YEARS  . Drug use: No    Comment: H/O COCAINE 24 YEARS AGO    No current facility-administered medications for this encounter.   Current Outpatient Medications:  .  levothyroxine (SYNTHROID, LEVOTHROID) 125 MCG tablet, Take 125 mcg by mouth daily before breakfast. , Disp: , Rfl: 0 .  Multiple Vitamins-Minerals (CENTRUM ADULTS PO), Take 1 tablet by mouth daily. , Disp: , Rfl:  .  simvastatin (ZOCOR) 20 MG tablet, Take 20 mg by mouth at bedtime. , Disp: , Rfl: 0 .  traMADol (ULTRAM) 50 MG tablet, tramadol 50 mg tablet  TK 1 T PO Q 6 H PRN, Disp: , Rfl:  .  Carboxymeth-Glycerin-Polysorb (REFRESH OPTIVE ADVANCED) 0.5-1-0.5 % SOLN, Place 1 drop into both eyes 2 (two) times daily as needed (dry eyes)., Disp: , Rfl:  .  clindamycin (CLEOCIN) 300 MG capsule, Take 1 capsule (300 mg total) by mouth 4 (four) times daily for 7  days., Disp: 28 capsule, Rfl: 0 .  docusate sodium (COLACE) 100 MG capsule, Take 1 capsule (100 mg total) by mouth daily as needed., Disp: 30 capsule, Rfl: 2 .  meloxicam (MOBIC) 15 MG tablet, Take 15 mg by mouth daily., Disp: , Rfl:  .  oxyCODONE-acetaminophen (PERCOCET) 10-325 MG tablet, Take 1 tablet by mouth every 6 (six) hours as needed for pain., Disp: 20 tablet, Rfl: 0 .  sulfamethoxazole-trimethoprim (BACTRIM DS,SEPTRA DS) 800-160 MG tablet, Take 2 tablets by mouth 2 (two) times daily for 7 days., Disp: 28 tablet, Rfl: 0  No Known Allergies   ROS  As noted in HPI.   Physical Exam  BP 93/68 (BP Location: Left Arm)   Pulse 81   Temp 97.9 F (36.6 C) (Oral)   Resp 16   Ht 5\' 8"  (1.727 m)   Wt 77.1 kg   SpO2 96%   BMI 25.85 kg/m   Constitutional: Well developed, well nourished, no acute distress Eyes:  EOMI, conjunctiva normal bilaterally HENT: Normocephalic, atraumatic,mucus membranes moist Respiratory: Normal inspiratory effort Cardiovascular: Normal rate GI: nondistended GU: Positive tender indurated mass measuring approximately 6.5 x 4 cm in the left perineal region.  Marked area of induration with a marker for reference.  Positive draining sinus with expressible purulent drainage.  Testicles, scrotum nontender, no erythema, induration. Rectal: Positive tenderness anteriorly. skin: No rash, skin intact Musculoskeletal: no deformities Neurologic: Alert & oriented x 3, no focal neuro deficits Psychiatric: Speech and behavior appropriate   ED Course   Medications - No data to display  Orders Placed This Encounter  Procedures  . Wound or Superficial Culture    Standing Status:   Standing    Number of Occurrences:   1    No results found for this or any previous visit (from the past 24 hour(s)). No results found.  ED Clinical Impression  Perirectal abscess   ED Assessment/Plan  Outside records reviewed.  As noted in HPI.  Discussed briefly with Dr.  Burt Knack, surgeon on-call.  Will place on Bactrim 2 tabs p.o. twice daily/clindamycin 300 mg 4 times daily for 7 days, have him follow-up with Dr. Hervey Ard, the surgeon who he saw for this in the past, tomorrow. do not think the patient needs urgent evaluation in the ER today, I think that he is stable for an outpatient evaluation. Called [facility] and arranged an appointment for tomorrow at 1345. Pt to arrive at 1330.  Wound culture sent.  Discussed  MDM, treatment plan, and plan for follow-up with patient. Discussed sn/sx that should prompt return to the ED. patient agrees with plan.   Meds ordered this encounter  Medications  . sulfamethoxazole-trimethoprim (BACTRIM DS,SEPTRA DS) 800-160 MG tablet    Sig: Take 2 tablets by mouth 2 (two) times daily for 7 days.    Dispense:  28 tablet  Refill:  0  . clindamycin (CLEOCIN) 300 MG capsule    Sig: Take 1 capsule (300 mg total) by mouth 4 (four) times daily for 7 days.    Dispense:  28 capsule    Refill:  0    *This clinic note was created using [employer]. Therefore, there may be occasional mistakes despite careful proofreading.   ?   Melynda Ripple, MD 11/24/17 [phone]

## 2017-11-24 NOTE — ED Triage Notes (Signed)
Patient c/o abscess below his scrotum for the past month.

## 2017-11-25 ENCOUNTER — Ambulatory Visit (INDEPENDENT_AMBULATORY_CARE_PROVIDER_SITE_OTHER): Payer: BLUE CROSS/BLUE SHIELD | Admitting: General Surgery

## 2017-11-25 ENCOUNTER — Encounter: Payer: Self-pay | Admitting: General Surgery

## 2017-11-25 VITALS — BP 140/72 | HR 80 | Resp 14 | Ht 68.0 in | Wt 181.0 lb

## 2017-11-25 DIAGNOSIS — K603 Anal fistula: Secondary | ICD-10-CM | POA: Insufficient documentation

## 2017-11-25 NOTE — Progress Notes (Signed)
Patient ID: William Ochoa, male   DOB: 1961/02/27, 57 y.o.   MRN: [id number]  Chief Complaint  Patient presents with  . Other    HPI William Ochoa is a 57 y.o. male here today for a evaluation of a peri anal abscess. Patient noticed this area about three months ago. Yesterday, he was seen in the urgent care which they drain the area. In 2017, he had this area drained at [facility].  HPI  Past Medical History:  Diagnosis Date  . Glaucoma 2015  . Hepatitis 1970'S   A OR B-PT CANNOT REMEMBER WHICH ONE  . Hyperlipidemia   . Hypothyroidism   . Thyroid disease     Past Surgical History:  Procedure Laterality Date  . ARTHOSCOPIC ROTAOR CUFF REPAIR Right 06/21/2017   Procedure: ARTHROSCOPIC ROTATOR CUFF REPAIR;  Surgeon: William Sheehan, MD;  Location: [facility];  Service: Orthopedics;  Laterality: Right;  . EYE SURGERY Left 2016   CORNEAL SURGERY  . FISTULOTOMY N/A 10/01/2015   Procedure: FISTULOTOMY;  Surgeon: William Bellow, MD;  Location: [facility];  Service: General;  Laterality: N/A;  . Jack  . INCISION AND DRAINAGE PERIRECTAL ABSCESS N/A 10/01/2015   Procedure: IRRIGATION AND DEBRIDEMENT PERIRECTAL ABSCESS;  Surgeon: William Bellow, MD;  [facility]: [facility];  Service: General;  Laterality: N/A;  . RESECTION DISTAL CLAVICAL  06/21/2017   Procedure: DISTAL CLAVICALEXCISION;  Surgeon: William Sheehan, MD;  Location: [facility];  Service: Orthopedics;;  . SHOULDER ARTHROSCOPY WITH BICEPSTENOTOMY Right 06/21/2017   Procedure: SHOULDER ARTHROSCOPY WITH BICEPSTENOTOMY;  Surgeon: William Sheehan, MD;  [facility]: [facility];  Service: Orthopedics;  Laterality: Right;  . SHOULDER ARTHROSCOPY WITH OPEN ROTATOR CUFF REPAIR Right 06/21/2017   Procedure: SHOULDER ARTHROSCOPY;  Surgeon: William Sheehan, MD;  [facility]: [facility];  Service: Orthopedics;  Laterality: Right;  . SIGMOIDOSCOPY N/A 10/01/2015   Procedure: SIGMOIDOSCOPY;  Surgeon: William Bellow, MD;  Location: [facility];   Service: General;  Laterality: N/A;  . SUBACROMIAL DECOMPRESSION Right 06/21/2017   Procedure: SUBACROMIAL DECOMPRESSION;  Surgeon: William Sheehan, MD;  [facility]: [facility];  Service: Orthopedics;  Laterality: Right;    Family History  Problem Relation Age of Onset  . Cancer Mother        skin  . Cancer Father        Prostate    Social History Social History   Tobacco Use  . Smoking status: Current Every Day Smoker    Packs/day: 1.00    Years: 30.00    Pack years: 30.00    Types: Cigarettes  . Smokeless tobacco: Never Used  Substance Use Topics  . Alcohol use: No    Alcohol/week: 0.0 standard drinks    Comment: SOBER FOR 24 YEARS  . Drug use: No    Comment: H/O COCAINE 24 YEARS AGO    No Known Allergies  Current Outpatient Medications  Medication Sig Dispense Refill  . Carboxymeth-Glycerin-Polysorb (REFRESH OPTIVE ADVANCED) 0.5-1-0.5 % SOLN Place 1 drop into both eyes 2 (two) times daily as needed (dry eyes).    . clindamycin (CLEOCIN) 300 MG capsule Take 1 capsule (300 mg total) by mouth 4 (four) times daily for 7 days. 28 capsule 0  . levothyroxine (SYNTHROID, LEVOTHROID) 125 MCG tablet Take 125 mcg by mouth daily before breakfast.   0  . Multiple Vitamins-Minerals (CENTRUM ADULTS PO) Take 1 tablet by mouth daily.     . simvastatin (ZOCOR) 20 MG tablet Take  20 mg by mouth at bedtime.   0  . sulfamethoxazole-trimethoprim (BACTRIM DS,SEPTRA DS) 800-160 MG tablet Take 2 tablets by mouth 2 (two) times daily for 7 days. 28 tablet 0  . traMADol (ULTRAM) 50 MG tablet tramadol 50 mg tablet  TK 1 T PO Q 6 H PRN     No current facility-administered medications for this visit.     Review of Systems Review of Systems  Constitutional: Negative.   Respiratory: Negative.   Cardiovascular: Negative.     Blood pressure 140/72, pulse 80, resp. rate 14, height 5\' 8"  (1.727 m), weight 181 lb (82.1 kg).  Physical Exam Physical Exam  Constitutional: He is oriented to person,  place, and time. He appears well-developed and well-nourished.  Eyes: Conjunctivae are normal. No scleral icterus.  Neck: Normal range of motion.  Cardiovascular: Normal rate, regular rhythm and normal heart sounds.  Pulmonary/Chest: Effort normal and breath sounds normal.  Genitourinary:     Genitourinary Comments: Scaring near the anus, thickening to the left anterior of the rectum.   Lymphadenopathy:    He has no cervical adenopathy.  Neurological: He is alert and oriented to person, place, and time.  Skin: Skin is warm and dry.    Data Reviewed Culture results from yesterday reported few WBC, polymorphonuclear cells, rare gram-positive cocci, rare gram-negative rod.  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  Adequately drained acutely.    Plan  Patient be scheduled for anal fistula debridement at [facility] electively.  Patient should complete previously prescribed antibiotics.    HPI, Physical Exam, Assessment and Plan have been scribed under the direction and in the presence of William Ard, MD.  William O[facility]a, CMA  HPI, Physical Exam, Assessment and Plan have been scribed under the direction and in the presence of William Ard, MD.  [facility], CMA  I have completed the exam and reviewed the above documentation for accuracy and completeness.  I agree with the above.  [facility] has been used and any errors in dictation or transcription are unintentional.  William Ochoa, M.D., F.A.C.S.  The patient is scheduled for surgery at [facility] on 01/03/18. He will pre admit by phone. The patient is aware of date and instructions.  Documented by William Otis Brace LPN  [facility] 11/25/2017, 9:24 PM

## 2017-11-25 NOTE — Patient Instructions (Addendum)
Patient be scheduled for anal fistula at [facility].   The patient is scheduled for surgery at [facility] on 01/03/18. He will pre admit by phone. The patient is aware of date and instructions.

## 2017-11-28 LAB — AEROBIC CULTURE  (SUPERFICIAL SPECIMEN)

## 2017-11-28 LAB — AEROBIC CULTURE W GRAM STAIN (SUPERFICIAL SPECIMEN): Culture: NORMAL

## 2017-12-27 ENCOUNTER — Other Ambulatory Visit: Payer: Self-pay

## 2017-12-27 ENCOUNTER — Encounter
Admission: RE | Admit: 2017-12-27 | Discharge: 2017-12-27 | Disposition: A | Discharge status: Home / Self Care | Payer: BLUE CROSS/BLUE SHIELD | Source: Ambulatory Visit | Attending: General Surgery | Admitting: General Surgery

## 2017-12-27 HISTORY — DX: Gastro-esophageal reflux disease without esophagitis: K21.9

## 2017-12-27 NOTE — Patient Instructions (Signed)
Your procedure is scheduled on: 01-03-18  Report to Same Day Surgery 2nd floor medical mall [facility] Entrance-take elevator on left to 2nd floor.  Check in with surgery information desk.) To find out your arrival time please call [phone] between 1PM - 3PM on 12-31-17  Remember: Instructions that are not followed completely may result in serious medical risk, up to and including death, or upon the discretion of your surgeon and anesthesiologist your surgery may need to be rescheduled.    _x___ 1. Do not eat food after midnight the night before your procedure. You may drink clear liquids up to 2 hours before you are scheduled to arrive at the hospital for your procedure.  Do not drink clear liquids within 2 hours of your scheduled arrival to the hospital.  Clear liquids include  --Water or Apple juice without pulp  --Clear carbohydrate beverage such as ClearFast or Gatorade  --Black Coffee or Clear Tea (No milk, no creamers, do not add anything to the coffee or Tea   ____Ensure clear carbohydrate drink on the way to the hospital for bariatric patients  ____Ensure clear carbohydrate drink 3 hours before surgery for Dr Dwyane Luo patients if physician instructed.   No gum chewing or hard candies.     __x__ 2. No Alcohol for 24 hours before or after surgery.   __x__3. No Smoking or e-cigarettes for 24 prior to surgery.  Do not use any chewable tobacco products for at least 6 hour prior to surgery   ____  4. Bring all medications with you on the day of surgery if instructed.    __x__ 5. Notify your doctor if there is any change in your medical condition     (cold, fever, infections).    x___6. On the morning of surgery brush your teeth with toothpaste and water.  You may rinse your mouth with mouth wash if you wish.  Do not swallow any toothpaste or mouthwash.   Do not wear jewelry, make-up, hairpins, clips or nail polish.  Do not wear lotions, powders, or perfumes. You may wear  deodorant.  Do not shave 48 hours prior to surgery. Men may shave face and neck.  Do not bring valuables to the hospital.    [facility] is not responsible for any belongings or valuables.               Contacts, dentures or bridgework may not be worn into surgery.  Leave your suitcase in the car. After surgery it may be brought to your room.  For patients admitted to the hospital, discharge time is determined by your treatment team.  _  Patients discharged the day of surgery will not be allowed to drive home.  You will need someone to drive you home and stay with you the night of your procedure.    Please read over the following fact sheets that you were given:   [facility] Preparing for Surgery   _x___ TAKE THE FOLLOWING MEDICATION THE MORNING OF SURGERY WITH A SMALL SIP OF WATER. These include:  1. LEVOTHYROXINE  2.  3.  4.  5.  6.  ____Fleets enema or Magnesium Citrate as directed.   ____ Use CHG Soap or sage wipes as directed on instruction sheet   ____ Use inhalers on the day of surgery and bring to hospital day of surgery  ____ Stop Metformin and Janumet 2 days prior to surgery.    ____ Take 1/2 of usual insulin dose the night before  surgery and none on the morning surgery.   ____ Follow recommendations from Cardiologist, Pulmonologist or PCP regarding stopping Aspirin, Coumadin, Plavix ,Eliquis, Effient, or Pradaxa, and Pletal.  ____Stop Anti-inflammatories such as Advil, Aleve, Ibuprofen, Motrin, Naproxen, Naprosyn, Goodies powders or aspirin products. OK to take Tylenol    ____ Stop supplements until after surgery.     ____ Bring C-Pap to the hospital.

## 2018-01-02 MED ORDER — SODIUM CHLORIDE 0.9 % IV SOLN
1.0000 g | INTRAVENOUS | Status: AC
  Administered 2018-01-03: 1 g via INTRAVENOUS
  Filled 2018-01-02: qty 1

## 2018-01-03 ENCOUNTER — Ambulatory Visit: Payer: BLUE CROSS/BLUE SHIELD | Admitting: Certified Registered Nurse Anesthetist

## 2018-01-03 ENCOUNTER — Ambulatory Visit
Admission: RE | Admit: 2018-01-03 | Discharge: 2018-01-03 | Disposition: A | Discharge status: Home / Self Care | Payer: BLUE CROSS/BLUE SHIELD | Source: Ambulatory Visit | Attending: General Surgery | Admitting: General Surgery

## 2018-01-03 ENCOUNTER — Encounter
Admission: RE | Disposition: A | Discharge status: Home / Self Care | Payer: Self-pay | Source: Ambulatory Visit | Attending: General Surgery

## 2018-01-03 ENCOUNTER — Encounter: Payer: Self-pay | Admitting: *Deleted

## 2018-01-03 ENCOUNTER — Other Ambulatory Visit: Payer: Self-pay

## 2018-01-03 DIAGNOSIS — E039 Hypothyroidism, unspecified: Secondary | ICD-10-CM | POA: Insufficient documentation

## 2018-01-03 DIAGNOSIS — K605 Anorectal fistula: Secondary | ICD-10-CM

## 2018-01-03 DIAGNOSIS — Z79899 Other long term (current) drug therapy: Secondary | ICD-10-CM | POA: Insufficient documentation

## 2018-01-03 DIAGNOSIS — F17210 Nicotine dependence, cigarettes, uncomplicated: Secondary | ICD-10-CM | POA: Insufficient documentation

## 2018-01-03 DIAGNOSIS — E785 Hyperlipidemia, unspecified: Secondary | ICD-10-CM | POA: Insufficient documentation

## 2018-01-03 DIAGNOSIS — K603 Anal fistula: Secondary | ICD-10-CM

## 2018-01-03 DIAGNOSIS — K219 Gastro-esophageal reflux disease without esophagitis: Secondary | ICD-10-CM | POA: Insufficient documentation

## 2018-01-03 HISTORY — PX: ANAL FISTULOTOMY: SHX6423

## 2018-01-03 LAB — URINE DRUG SCREEN, QUALITATIVE (ARMC ONLY)
Amphetamines, Ur Screen: NOT DETECTED
Barbiturates, Ur Screen: NOT DETECTED
Benzodiazepine, Ur Scrn: NOT DETECTED
COCAINE METABOLITE, UR ~~LOC~~: NOT DETECTED
Cannabinoid 50 Ng, Ur ~~LOC~~: NOT DETECTED
MDMA (Ecstasy)Ur Screen: NOT DETECTED
Methadone Scn, Ur: NOT DETECTED
OPIATE, UR SCREEN: NOT DETECTED
PHENCYCLIDINE (PCP) UR S: NOT DETECTED
Tricyclic, Ur Screen: NOT DETECTED

## 2018-01-03 SURGERY — ANAL FISTULOTOMY
Anesthesia: General

## 2018-01-03 MED ORDER — DEXAMETHASONE SODIUM PHOSPHATE 10 MG/ML IJ SOLN
INTRAMUSCULAR | Status: DC | PRN
  Administered 2018-01-03: 5 mg via INTRAVENOUS

## 2018-01-03 MED ORDER — HYDROCODONE-ACETAMINOPHEN 5-325 MG PO TABS: 1.0000 | ORAL_TABLET | ORAL | 0 refills | Status: DC | PRN

## 2018-01-03 MED ORDER — LIDOCAINE HCL (PF) 2 % IJ SOLN
INTRAMUSCULAR | Status: AC
  Filled 2018-01-03: qty 10

## 2018-01-03 MED ORDER — FENTANYL CITRATE (PF) 100 MCG/2ML IJ SOLN
INTRAMUSCULAR | Status: AC
  Filled 2018-01-03: qty 2

## 2018-01-03 MED ORDER — FAMOTIDINE 20 MG PO TABS
20.0000 mg | ORAL_TABLET | Freq: Once | ORAL | Status: AC
  Administered 2018-01-03: 20 mg via ORAL

## 2018-01-03 MED ORDER — EPHEDRINE SULFATE 50 MG/ML IJ SOLN
INTRAMUSCULAR | Status: AC
  Filled 2018-01-03: qty 1

## 2018-01-03 MED ORDER — OXYCODONE HCL 5 MG PO TABS: 5.0000 mg | ORAL_TABLET | Freq: Once | ORAL | Status: DC | PRN

## 2018-01-03 MED ORDER — PHENYLEPHRINE HCL 10 MG/ML IJ SOLN
INTRAMUSCULAR | Status: AC
  Filled 2018-01-03: qty 1

## 2018-01-03 MED ORDER — BUPIVACAINE-EPINEPHRINE (PF) 0.5% -1:200000 IJ SOLN
INTRAMUSCULAR | Status: AC
  Filled 2018-01-03: qty 30

## 2018-01-03 MED ORDER — MEPERIDINE HCL 50 MG/ML IJ SOLN: 6.2500 mg | INTRAMUSCULAR | Status: DC | PRN

## 2018-01-03 MED ORDER — PROMETHAZINE HCL 25 MG/ML IJ SOLN: 6.2500 mg | INTRAMUSCULAR | Status: DC | PRN

## 2018-01-03 MED ORDER — KETOROLAC TROMETHAMINE 30 MG/ML IJ SOLN
INTRAMUSCULAR | Status: AC
  Filled 2018-01-03: qty 1

## 2018-01-03 MED ORDER — FENTANYL CITRATE (PF) 100 MCG/2ML IJ SOLN
INTRAMUSCULAR | Status: DC | PRN
  Administered 2018-01-03 (×2): 25 ug via INTRAVENOUS
  Administered 2018-01-03: 50 ug via INTRAVENOUS

## 2018-01-03 MED ORDER — EPHEDRINE SULFATE 50 MG/ML IJ SOLN
INTRAMUSCULAR | Status: DC | PRN
  Administered 2018-01-03: 10 mg via INTRAVENOUS

## 2018-01-03 MED ORDER — KETOROLAC TROMETHAMINE 30 MG/ML IJ SOLN
INTRAMUSCULAR | Status: DC | PRN
  Administered 2018-01-03: 30 mg via INTRAVENOUS

## 2018-01-03 MED ORDER — ONDANSETRON HCL 4 MG/2ML IJ SOLN
INTRAMUSCULAR | Status: DC | PRN
  Administered 2018-01-03: 4 mg via INTRAVENOUS

## 2018-01-03 MED ORDER — LACTATED RINGERS IV SOLN
INTRAVENOUS | Status: DC
  Administered 2018-01-03 (×2): via INTRAVENOUS

## 2018-01-03 MED ORDER — BUPIVACAINE LIPOSOME 1.3 % IJ SUSP
INTRAMUSCULAR | Status: AC
  Filled 2018-01-03: qty 20

## 2018-01-03 MED ORDER — MIDAZOLAM HCL 2 MG/2ML IJ SOLN
INTRAMUSCULAR | Status: AC
  Filled 2018-01-03: qty 2

## 2018-01-03 MED ORDER — BUPIVACAINE-EPINEPHRINE 0.5% -1:200000 IJ SOLN
INTRAMUSCULAR | Status: DC | PRN
  Administered 2018-01-03: 30 mL

## 2018-01-03 MED ORDER — ACETAMINOPHEN 10 MG/ML IV SOLN
INTRAVENOUS | Status: DC | PRN
  Administered 2018-01-03: 1000 mg via INTRAVENOUS

## 2018-01-03 MED ORDER — OXYCODONE HCL 5 MG/5ML PO SOLN: 5.0000 mg | Freq: Once | ORAL | Status: DC | PRN

## 2018-01-03 MED ORDER — FENTANYL CITRATE (PF) 100 MCG/2ML IJ SOLN: 25.0000 ug | INTRAMUSCULAR | Status: DC | PRN

## 2018-01-03 MED ORDER — LIDOCAINE HCL (CARDIAC) PF 100 MG/5ML IV SOSY
PREFILLED_SYRINGE | INTRAVENOUS | Status: DC | PRN
  Administered 2018-01-03: 100 mg via INTRAVENOUS

## 2018-01-03 MED ORDER — FAMOTIDINE 20 MG PO TABS
ORAL_TABLET | ORAL | Status: AC
  Filled 2018-01-03: qty 1

## 2018-01-03 MED ORDER — ACETAMINOPHEN 10 MG/ML IV SOLN
INTRAVENOUS | Status: AC
  Filled 2018-01-03: qty 100

## 2018-01-03 MED ORDER — PROPOFOL 10 MG/ML IV BOLUS
INTRAVENOUS | Status: DC | PRN
  Administered 2018-01-03: 160 mg via INTRAVENOUS
  Administered 2018-01-03: 40 mg via INTRAVENOUS

## 2018-01-03 MED ORDER — MIDAZOLAM HCL 2 MG/2ML IJ SOLN
INTRAMUSCULAR | Status: DC | PRN
  Administered 2018-01-03: 2 mg via INTRAVENOUS

## 2018-01-03 MED ORDER — PROPOFOL 10 MG/ML IV BOLUS
INTRAVENOUS | Status: AC
  Filled 2018-01-03: qty 40

## 2018-01-03 MED ORDER — PROPOFOL 10 MG/ML IV BOLUS
INTRAVENOUS | Status: AC
  Filled 2018-01-03: qty 20

## 2018-01-03 SURGICAL SUPPLY — 28 items
BLADE SURG 15 STRL SS SAFETY (BLADE) ×3 IMPLANT
BRIEF STRETCH MATERNITY 2XLG (MISCELLANEOUS) ×3 IMPLANT
CANISTER SUCT 1200ML W/VALVE (MISCELLANEOUS) ×3 IMPLANT
DRAPE LAPAROTOMY 100X77 ABD (DRAPES) ×3 IMPLANT
DRAPE LEGGINS SURG 28X43 STRL (DRAPES) ×3 IMPLANT
DRAPE UNDER BUTTOCK W/FLU (DRAPES) ×3 IMPLANT
DRSG GAUZE PETRO 6X36 STRIP ST (GAUZE/BANDAGES/DRESSINGS) ×3 IMPLANT
ELECT REM PT RETURN 9FT ADLT (ELECTROSURGICAL) ×3
ELECTRODE REM PT RTRN 9FT ADLT (ELECTROSURGICAL) ×1 IMPLANT
GLOVE BIO SURGEON STRL SZ7.5 (GLOVE) ×9 IMPLANT
GLOVE INDICATOR 8.0 STRL GRN (GLOVE) ×9 IMPLANT
GOWN STRL REUS W/ TWL LRG LVL3 (GOWN DISPOSABLE) ×3 IMPLANT
GOWN STRL REUS W/TWL LRG LVL3 (GOWN DISPOSABLE) ×6
KIT TURNOVER KIT A (KITS) ×3 IMPLANT
LABEL OR SOLS (LABEL) ×3 IMPLANT
NEEDLE HYPO 22GX1.5 SAFETY (NEEDLE) ×3 IMPLANT
NEEDLE HYPO 25X1 1.5 SAFETY (NEEDLE) ×3 IMPLANT
NS IRRIG 500ML POUR BTL (IV SOLUTION) ×3 IMPLANT
PACK BASIN MINOR ARMC (MISCELLANEOUS) ×3 IMPLANT
PAD OB MATERNITY 4.3X12.25 (PERSONAL CARE ITEMS) ×3 IMPLANT
PAD PREP 24X41 OB/GYN DISP (PERSONAL CARE ITEMS) ×3 IMPLANT
SOL PREP PVP 2OZ (MISCELLANEOUS) ×3
SOLUTION PREP PVP 2OZ (MISCELLANEOUS) ×1 IMPLANT
SURGILUBE 2OZ TUBE FLIPTOP (MISCELLANEOUS) ×3 IMPLANT
SUT SILK 0 CT 1 30 (SUTURE) IMPLANT
SUT VIC AB 3-0 SH 27 (SUTURE) ×2
SUT VIC AB 3-0 SH 27X BRD (SUTURE) ×1 IMPLANT
SYR CONTROL 10ML (SYRINGE) ×3 IMPLANT

## 2018-01-03 NOTE — Anesthesia Procedure Notes (Signed)
Procedure Name: LMA Insertion Date/Time: 01/03/2018 8:51 AM Performed by: Lowry Bowl, CRNA Pre-anesthesia Checklist: Patient identified, Emergency Drugs available, Patient being monitored, Timeout performed and Suction available Patient Re-evaluated:Patient Re-evaluated prior to induction Oxygen Delivery Method: Circle system utilized Preoxygenation: Pre-oxygenation with 100% oxygen Induction Type: IV induction LMA: LMA inserted LMA Size: 4.0 Number of attempts: 1 Placement Confirmation: positive ETCO2 and breath sounds checked- equal and bilateral Tube secured with: Tape Dental Injury: Teeth and Oropharynx as per pre-operative assessment

## 2018-01-03 NOTE — Discharge Instructions (Signed)

## 2018-01-03 NOTE — Anesthesia Preprocedure Evaluation (Signed)
Anesthesia Evaluation  Patient identified by MRN, date of birth, ID band Patient awake    Reviewed: Allergy & Precautions, NPO status , Patient's Chart, lab work & pertinent test results  History of Anesthesia Complications Negative for: history of anesthetic complications  Airway Mallampati: II  TM Distance: >3 FB Neck ROM: Full    Dental  (+) Edentulous Lower, Edentulous Upper   Pulmonary neg sleep apnea, neg COPD, Current Smoker,    breath sounds clear to auscultation- rhonchi (-) wheezing      Cardiovascular Exercise Tolerance: Good (-) hypertension(-) CAD, (-) Past MI, (-) Cardiac Stents and (-) CABG  Rhythm:Regular Rate:Normal - Systolic murmurs and - Diastolic murmurs    Neuro/Psych negative neurological ROS  negative psych ROS   GI/Hepatic GERD  ,(+) Hepatitis -  Endo/Other  neg diabetesHypothyroidism   Renal/GU negative Renal ROS     Musculoskeletal negative musculoskeletal ROS (+)   Abdominal (+) - obese,   Peds  Hematology negative hematology ROS (+)   Anesthesia Other Findings Past Medical History: No date: GERD (gastroesophageal reflux disease)     Comment:  rare-no meds 2015: Glaucoma 1970'S: Hepatitis     Comment:  A OR B-PT CANNOT REMEMBER WHICH ONE No date: Hyperlipidemia No date: Hypothyroidism No date: Thyroid disease   Reproductive/Obstetrics                             Anesthesia Physical  Anesthesia Plan  ASA: II  Anesthesia Plan: General   Post-op Pain Management:    Induction: Intravenous  PONV Risk Score and Plan: 0 and Ondansetron and Midazolam  Airway Management Planned: LMA  Additional Equipment:   Intra-op Plan:   Post-operative Plan:   Informed Consent: I have reviewed the patients History and Physical, chart, labs and discussed the procedure including the risks, benefits and alternatives for the proposed anesthesia with the patient or  authorized representative who has indicated his/her understanding and acceptance.     Dental advisory given  Plan Discussed with: CRNA and Anesthesiologist  Anesthesia Plan Comments:         Anesthesia Quick Evaluation  

## 2018-01-03 NOTE — Transfer of Care (Signed)
Immediate Anesthesia Transfer of Care Note  Patient: [facility]  Procedure(s) Performed: ANAL FISTULOTOMY (N/A )  Patient Location: PACU  Anesthesia Type:General  Level of Consciousness: awake, sedated and patient cooperative  Airway & Oxygen Therapy: Patient Spontanous Breathing and Patient connected to face mask oxygen  Post-op Assessment: Report given to RN, Post -op Vital signs reviewed and stable and Patient moving all extremities  Post vital signs: Reviewed and stable  Last Vitals:  Vitals Value Taken Time  BP 115/66 01/03/2018  9:39 AM  Temp    Pulse 78 01/03/2018  9:41 AM  Resp 16 01/03/2018  9:41 AM  SpO2 97 % 01/03/2018  9:41 AM  Vitals shown include unvalidated device data.  Last Pain:  Vitals:   01/03/18 0743  TempSrc: Temporal  PainSc: 7          Complications: No apparent anesthesia complications

## 2018-01-03 NOTE — H&P (Signed)
William [facility]a [id number] March 03, 1961     HPI: Healthy 57 y.o male with new anal fistula. For debridement.   Medications Prior to Admission  Medication Sig Dispense Refill Last Dose  . Carboxymeth-Glycerin-Polysorb (REFRESH OPTIVE ADVANCED) 0.5-1-0.5 % SOLN Place 1 drop into both eyes 2 (two) times daily as needed (for dry eyes).    01/03/2018 at Unknown time  . ibuprofen (ADVIL,MOTRIN) 200 MG tablet Take 200 mg by mouth every 6 (six) hours as needed for headache or moderate pain.   12/27/2017 at Unknown time  . levothyroxine (SYNTHROID, LEVOTHROID) 125 MCG tablet Take 125 mcg by mouth daily before breakfast.   0 01/03/2018 at Unknown time  . Multiple Vitamins-Minerals (CENTRUM ADULTS PO) Take 1 tablet by mouth daily.    01/02/2018 at Unknown time  . OVER THE COUNTER MEDICATION Apply 1 application topically 3 (three) times daily as needed (for boil relief). Boil Relief Ointment   01/02/2018 at Unknown time  . simvastatin (ZOCOR) 20 MG tablet Take 20 mg by mouth at bedtime.   0 01/02/2018 at Unknown time  . traMADol (ULTRAM) 50 MG tablet Take 50 mg by mouth every 6 (six) hours as needed for moderate pain.    Not Taking at Unknown time   No Known Allergies Past Medical History:  Diagnosis Date  . GERD (gastroesophageal reflux disease)    rare-no meds  . Glaucoma 2015  . Hepatitis 1970'S   A OR B-PT CANNOT REMEMBER WHICH ONE  . Hyperlipidemia   . Hypothyroidism   . Thyroid disease    Past Surgical History:  Procedure Laterality Date  . ARTHOSCOPIC ROTAOR CUFF REPAIR Right 06/21/2017   Procedure: ARTHROSCOPIC ROTATOR CUFF REPAIR;  Surgeon: Lovell Sheehan, MD;  Location: [facility];  Service: Orthopedics;  Laterality: Right;  . EYE SURGERY Left 2016   CORNEAL SURGERY  . FISTULOTOMY N/A 10/01/2015   Procedure: FISTULOTOMY;  Surgeon: Robert Bellow, MD;  Location: [facility];  Service: General;  Laterality: N/A;  . [place]  . INCISION AND DRAINAGE PERIRECTAL ABSCESS N/A  10/01/2015   Procedure: IRRIGATION AND DEBRIDEMENT PERIRECTAL ABSCESS;  Surgeon: Robert Bellow, MD;  Location: [facility];  Service: General;  Laterality: N/A;  . RESECTION DISTAL CLAVICAL  06/21/2017   Procedure: DISTAL CLAVICALEXCISION;  Surgeon: Lovell Sheehan, MD;  Location: [facility];  Service: Orthopedics;;  . SHOULDER ARTHROSCOPY WITH BICEPSTENOTOMY Right 06/21/2017   Procedure: SHOULDER ARTHROSCOPY WITH BICEPSTENOTOMY;  Surgeon: Lovell Sheehan, MD;  Location: [facility];  Service: Orthopedics;  Laterality: Right;  . SHOULDER ARTHROSCOPY WITH OPEN ROTATOR CUFF REPAIR Right 06/21/2017   Procedure: SHOULDER ARTHROSCOPY;  Surgeon: Lovell Sheehan, MD;  Location: [facility];  Service: Orthopedics;  Laterality: Right;  . SIGMOIDOSCOPY N/A 10/01/2015   Procedure: SIGMOIDOSCOPY;  Surgeon: Robert Bellow, MD;  Location: [facility];  Service: General;  Laterality: N/A;  . SUBACROMIAL DECOMPRESSION Right 06/21/2017   Procedure: SUBACROMIAL DECOMPRESSION;  Surgeon: Lovell Sheehan, MD;  Location: [facility];  Service: Orthopedics;  Laterality: Right;   Social History   Socioeconomic History  . Marital status: Single    Spouse name: Not on file  . Number of children: Not on file  . Years of education: Not on file  . Highest education level: Not on file  Occupational History  . Not on file  Social Needs  . Financial resource strain: Not on file  . Food insecurity:    Worry: Not on file    Inability:  Not on file  . Transportation needs:    Medical: Not on file    Non-medical: Not on file  Tobacco Use  . Smoking status: Current Every Day Smoker    Packs/day: 1.00    Years: 30.00    Pack years: 30.00    Types: Cigarettes  . Smokeless tobacco: Never Used  Substance and Sexual Activity  . Alcohol use: No    Alcohol/week: 0.0 standard drinks    Comment: SOBER FOR 25 YEARS  . Drug use: No    Comment: H/O COCAINE 25 YEARS AGO  . Sexual activity: Not on file  Lifestyle  . Physical activity:     Days per week: Not on file    Minutes per session: Not on file  . Stress: Not on file  Relationships  . Social connections:    Talks on phone: Not on file    Gets together: Not on file    Attends religious service: Not on file    Active member of club or organization: Not on file    Attends meetings of clubs or organizations: Not on file    Relationship status: Not on file  . Intimate partner violence:    Fear of current or ex partner: Not on file    Emotionally abused: Not on file    Physically abused: Not on file    Forced sexual activity: Not on file  Other Topics Concern  . Not on file  Social History Narrative  . Not on file   Social History   Social History Narrative  . Not on file     ROS: Negative.     PE: HEENT: Negative. Lungs: Clear. Cardio: RRForest Gleason Halla Chopp 01/03/2018   Assessment/Plan:  Proceed with planned anal fistulotomy.

## 2018-01-03 NOTE — Anesthesia Postprocedure Evaluation (Signed)
Anesthesia Post Note  Patient: [facility]  Procedure(s) Performed: ANAL FISTULOTOMY (N/A )  Patient location during evaluation: PACU Anesthesia Type: General Level of consciousness: awake and alert and oriented Pain management: pain level controlled Vital Signs Assessment: post-procedure vital signs reviewed and stable Respiratory status: spontaneous breathing, nonlabored ventilation and respiratory function stable Cardiovascular status: blood pressure returned to baseline and stable Postop Assessment: no signs of nausea or vomiting Anesthetic complications: no     Last Vitals:  Vitals:   01/03/18 1009 01/03/18 1023  BP: 100/65 120/65  Pulse: 75 72  Resp: 18 16  Temp: 36.4 C (!) 36.3 C  SpO2: 96% 94%    Last Pain:  Vitals:   01/03/18 1023  TempSrc: Temporal  PainSc: 0-No pain                 Justyna Timoney

## 2018-01-03 NOTE — Anesthesia Post-op Follow-up Note (Signed)
Anesthesia QCDR form completed.        

## 2018-01-03 NOTE — Op Note (Signed)
Preoperative diagnosis: Anal fistula.  Postoperative diagnosis: Same.  Operative procedure: Fistulotomy.  Operating Surgeon: Hervey Ard, MD.  Anesthesia: General by LMA, Marcaine 0.5% with 1 to 200,000 units of epinephrine, 30 cc.  Estimated blood loss: 5 cc.  Clinical note: This 57 year old male has developed a new perirectal abscess extending up just outside the median raphe towards the base of the scrotum.  A similar episode on the contralateral side occurred 2 years ago.  He responded well to fistulotomy at that time and is brought to the operating at this time for planned fistulotomy once again.  Operative note: The patient received preoperative antibiotics.  He underwent general anesthesia without difficulty.  He was placed in dorsolithotomy position and the perineum cleansed with Betadine solution and draped.  Scarring on the right side from his previous fistulotomy was noted.  On the left side there was a very firm area until the left of the midline and this was incised after infiltration of local anesthetic.  Chronic inflammatory tissue with just a tiny drop of purulent fluid was noted.  The incision was expanded and a small lacrimal duct probe advanced to the inner opening.  The course was then filleted open with cautery and hemostasis achieved with electrocautery.  The base was then scraped with curette.  Palpation showed the majority of the anal sphincter was intact, the superficial component having been divided.  A Vaseline gauze was placed along the wound followed by a peri-pad.  The patient tolerated the procedure well and was taken to recovery room in stable condition.

## 2018-01-13 ENCOUNTER — Encounter: Payer: Self-pay | Admitting: General Surgery

## 2018-01-13 ENCOUNTER — Ambulatory Visit (INDEPENDENT_AMBULATORY_CARE_PROVIDER_SITE_OTHER): Payer: Self-pay | Admitting: General Surgery

## 2018-01-13 VITALS — BP 128/78 | HR 72 | Resp 12 | Ht 67.0 in | Wt 178.0 lb

## 2018-01-13 DIAGNOSIS — K611 Rectal abscess: Secondary | ICD-10-CM

## 2018-01-13 MED ORDER — NYSTATIN-TRIAMCINOLONE 100000-0.1 UNIT/GM-% EX OINT: 1.0000 "application " | TOPICAL_OINTMENT | Freq: Three times a day (TID) | CUTANEOUS | 1 refills | Status: AC

## 2018-01-13 MED ORDER — TRAMADOL HCL 50 MG PO TABS: 50.0000 mg | ORAL_TABLET | Freq: Four times a day (QID) | ORAL | 0 refills | Status: DC | PRN

## 2018-01-13 NOTE — Progress Notes (Signed)
Patient ID: William Ochoa, male   DOB: Apr 18, 1960, 57 y.o.   MRN: [id number]  Chief Complaint  Patient presents with  . Routine Post Op    HPI William Ochoa is a 57 y.o. male here today for his post op anal fistula done on 01/03/2018. No bleeding but pain when he sits down Moving his bowels daily.  HPI  Past Medical History:  Diagnosis Date  . GERD (gastroesophageal reflux disease)    rare-no meds  . Glaucoma 2015  . Hepatitis 1970'S   A OR B-PT CANNOT REMEMBER WHICH ONE  . Hyperlipidemia   . Hypothyroidism   . Thyroid disease     Past Surgical History:  Procedure Laterality Date  . ANAL FISTULOTOMY N/A 01/03/2018   Procedure: ANAL FISTULOTOMY;  Surgeon: Robert Bellow, MD;  Location: [facility];  Service: General;  Laterality: N/A;  . ARTHOSCOPIC ROTAOR CUFF REPAIR Right 06/21/2017   Procedure: ARTHROSCOPIC ROTATOR CUFF REPAIR;  Surgeon: Lovell Sheehan, MD;  Location: [facility];  Service: Orthopedics;  Laterality: Right;  . EYE SURGERY Left 2016   CORNEAL SURGERY  . FISTULOTOMY N/A 10/01/2015   Procedure: FISTULOTOMY;  Surgeon: Robert Bellow, MD;  Location: [facility];  Service: General;  Laterality: N/A;  . [place]  . INCISION AND DRAINAGE PERIRECTAL ABSCESS N/A 10/01/2015   Procedure: IRRIGATION AND DEBRIDEMENT PERIRECTAL ABSCESS;  Surgeon: Robert Bellow, MD;  Location: [facility];  Service: General;  Laterality: N/A;  . RESECTION DISTAL CLAVICAL  06/21/2017   Procedure: DISTAL CLAVICALEXCISION;  Surgeon: Lovell Sheehan, MD;  Location: [facility];  Service: Orthopedics;;  . SHOULDER ARTHROSCOPY WITH BICEPSTENOTOMY Right 06/21/2017   Procedure: SHOULDER ARTHROSCOPY WITH BICEPSTENOTOMY;  Surgeon: Lovell Sheehan, MD;  Location: [facility];  Service: Orthopedics;  Laterality: Right;  . SHOULDER ARTHROSCOPY WITH OPEN ROTATOR CUFF REPAIR Right 06/21/2017   Procedure: SHOULDER ARTHROSCOPY;  Surgeon: Lovell Sheehan, MD;  Location: [facility];  Service:  Orthopedics;  Laterality: Right;  . SIGMOIDOSCOPY N/A 10/01/2015   Procedure: SIGMOIDOSCOPY;  Surgeon: Robert Bellow, MD;  Location: [facility];  Service: General;  Laterality: N/A;  . SUBACROMIAL DECOMPRESSION Right 06/21/2017   Procedure: SUBACROMIAL DECOMPRESSION;  Surgeon: Lovell Sheehan, MD;  Location: [facility];  Service: Orthopedics;  Laterality: Right;    Family History  Problem Relation Age of Onset  . Cancer Mother        skin  . Cancer Father        Prostate    Social History Social History   Tobacco Use  . Smoking status: Current Every Day Smoker    Packs/day: 1.00    Years: 30.00    Pack years: 30.00    Types: Cigarettes  . Smokeless tobacco: Never Used  Substance Use Topics  . Alcohol use: No    Alcohol/week: 0.0 standard drinks    Comment: SOBER FOR 25 YEARS  . Drug use: No    Comment: H/O COCAINE 25 YEARS AGO    No Known Allergies  Current Outpatient Medications  Medication Sig Dispense Refill  . Carboxymeth-Glycerin-Polysorb (REFRESH OPTIVE ADVANCED) 0.5-1-0.5 % SOLN Place 1 drop into both eyes 2 (two) times daily as needed (for dry eyes).     Marland Kitchen HYDROcodone-acetaminophen (NORCO/VICODIN) 5-325 MG tablet Take 1-2 tablets by mouth every 4 (four) hours as needed for moderate pain. 30 tablet 0  . ibuprofen (ADVIL,MOTRIN) 200 MG tablet Take 200 mg by mouth every 6 (six) hours as needed for  headache or moderate pain.    Marland Kitchen levothyroxine (SYNTHROID, LEVOTHROID) 125 MCG tablet Take 125 mcg by mouth daily before breakfast.   0  . Multiple Vitamins-Minerals (CENTRUM ADULTS PO) Take 1 tablet by mouth daily.     Marland Kitchen OVER THE COUNTER MEDICATION Apply 1 application topically 3 (three) times daily as needed (for boil relief). Boil Relief Ointment    . simvastatin (ZOCOR) 20 MG tablet Take 20 mg by mouth at bedtime.   0  . traMADol (ULTRAM) 50 MG tablet Take 50 mg by mouth every 6 (six) hours as needed for moderate pain.     Marland Kitchen nystatin-triamcinolone ointment (MYCOLOG) Apply  1 application topically 3 (three) times daily. 30 g 1  . traMADol (ULTRAM) 50 MG tablet Take 1 tablet (50 mg total) by mouth every 6 (six) hours as needed. 20 tablet 0   No current facility-administered medications for this visit.     Review of Systems Review of Systems  Constitutional: Negative.   Respiratory: Negative.   Cardiovascular: Negative.     Blood pressure 128/78, pulse 72, resp. rate 12, height 5\' 7"  (1.702 m), weight 178 lb (80.7 kg).  Physical Exam Physical Exam  Constitutional: He is oriented to person, place, and time. He appears well-developed and well-nourished.  Genitourinary:     Neurological: He is alert and oriented to person, place, and time.  Skin: Skin is warm and dry.    Data Reviewed Wound culture of November 24, 2017 showed skin flora.  Assessment    Cutaneous yeast infection secondary to dampness no surgical site.  Pain secondary to local inflammation.    Plan  I think the use of triamcinolone cream with nystatin will be helpful, this will be applied 3 times daily.  A renewal on his Ultram was provided.  The importance of changing the pad frequently to keep the area dry and to continue to use a thin layer of Vaseline over the surgical site was emphasized.  Rx sent, use the cream three times daily.  Return in 2 weeks. The patient is aware to call back for any questions or concerns.  HPI, Physical Exam, Assessment and Plan have been scribed under the direction and in the presence of Hervey Ard, MD.  Gaspar Cola, CMA  I have completed the exam and reviewed the above documentation for accuracy and completeness.  I agree with the above.  Haematologist has been used and any errors in dictation or transcription are unintentional.  Hervey Ard, M.D., F.A.C.S.  Forest Gleason Byrnett 01/13/2018, 9:26 AM

## 2018-01-13 NOTE — Patient Instructions (Addendum)
Rx sent, use the cream  three times daily  Return in two weeks.

## 2018-01-31 ENCOUNTER — Ambulatory Visit (INDEPENDENT_AMBULATORY_CARE_PROVIDER_SITE_OTHER): Payer: Self-pay | Admitting: General Surgery

## 2018-01-31 ENCOUNTER — Encounter: Payer: Self-pay | Admitting: General Surgery

## 2018-01-31 VITALS — BP 130/82 | HR 78 | Resp 13 | Ht 67.0 in | Wt 180.0 lb

## 2018-01-31 DIAGNOSIS — K603 Anal fistula: Secondary | ICD-10-CM

## 2018-01-31 NOTE — Patient Instructions (Addendum)
Rern in two weeks. The patient is aware to call back for any questions or concerns.

## 2018-01-31 NOTE — Progress Notes (Signed)
Patient ID: William Ochoa, male   DOB: 1961/03/21, 57 y.o.   MRN: [id number]  Chief Complaint  Patient presents with  . Routine Post Op    HPI William Ochoa is a 57 y.o. male here today for his post op anal fistula done on 01/03/2018. Patient states there is a hard knot below his rectal area.  He states it is painful to sit, but improved. No drainage.  No difficulty with bowel function or control. HPI  Past Medical History:  Diagnosis Date  . GERD (gastroesophageal reflux disease)    rare-no meds  . Glaucoma 2015  . Hepatitis 1970'S   A OR B-PT CANNOT REMEMBER WHICH ONE  . Hyperlipidemia   . Hypothyroidism   . Thyroid disease     Past Surgical History:  Procedure Laterality Date  . ANAL FISTULOTOMY N/A 01/03/2018   Procedure: ANAL FISTULOTOMY;  Surgeon: Robert Bellow, MD;  Location: [facility];  Service: General;  Laterality: N/A;  . ARTHOSCOPIC ROTAOR CUFF REPAIR Right 06/21/2017   Procedure: ARTHROSCOPIC ROTATOR CUFF REPAIR;  Surgeon: Lovell Sheehan, MD;  Location: [facility];  Service: Orthopedics;  Laterality: Right;  . EYE SURGERY Left 2016   CORNEAL SURGERY  . FISTULOTOMY N/A 10/01/2015   Procedure: FISTULOTOMY;  Surgeon: Robert Bellow, MD;  Location: [facility];  Service: General;  Laterality: N/A;  . [place]  . INCISION AND DRAINAGE PERIRECTAL ABSCESS N/A 10/01/2015   Procedure: IRRIGATION AND DEBRIDEMENT PERIRECTAL ABSCESS;  Surgeon: Robert Bellow, MD;  Location: [facility];  Service: General;  Laterality: N/A;  . RESECTION DISTAL CLAVICAL  06/21/2017   Procedure: DISTAL CLAVICALEXCISION;  Surgeon: Lovell Sheehan, MD;  Location: [facility];  Service: Orthopedics;;  . SHOULDER ARTHROSCOPY WITH BICEPSTENOTOMY Right 06/21/2017   Procedure: SHOULDER ARTHROSCOPY WITH BICEPSTENOTOMY;  Surgeon: Lovell Sheehan, MD;  Location: [facility];  Service: Orthopedics;  Laterality: Right;  . SHOULDER ARTHROSCOPY WITH OPEN ROTATOR CUFF REPAIR Right 06/21/2017   Procedure: SHOULDER ARTHROSCOPY;  Surgeon: Lovell Sheehan, MD;  Location: [facility];  Service: Orthopedics;  Laterality: Right;  . SIGMOIDOSCOPY N/A 10/01/2015   Procedure: SIGMOIDOSCOPY;  Surgeon: Robert Bellow, MD;  Location: [facility];  Service: General;  Laterality: N/A;  . SUBACROMIAL DECOMPRESSION Right 06/21/2017   Procedure: SUBACROMIAL DECOMPRESSION;  Surgeon: Lovell Sheehan, MD;  Location: [facility];  Service: Orthopedics;  Laterality: Right;    Family History  Problem Relation Age of Onset  . Cancer Mother        skin  . Cancer Father        Prostate    Social History Social History   Tobacco Use  . Smoking status: Current Every Day Smoker    Packs/day: 1.00    Years: 30.00    Pack years: 30.00    Types: Cigarettes  . Smokeless tobacco: Never Used  Substance Use Topics  . Alcohol use: No    Alcohol/week: 0.0 standard drinks    Comment: SOBER FOR 25 YEARS  . Drug use: No    Comment: H/O COCAINE 25 YEARS AGO    No Known Allergies  Current Outpatient Medications  Medication Sig Dispense Refill  . Carboxymeth-Glycerin-Polysorb (REFRESH OPTIVE ADVANCED) 0.5-1-0.5 % SOLN Place 1 drop into both eyes 2 (two) times daily as needed (for dry eyes).     Marland Kitchen HYDROcodone-acetaminophen (NORCO/VICODIN) 5-325 MG tablet Take 1-2 tablets by mouth every 4 (four) hours as needed for moderate pain. 30 tablet 0  .  ibuprofen (ADVIL,MOTRIN) 200 MG tablet Take 200 mg by mouth every 6 (six) hours as needed for headache or moderate pain.    Marland Kitchen levothyroxine (SYNTHROID, LEVOTHROID) 125 MCG tablet Take 125 mcg by mouth daily before breakfast.   0  . Multiple Vitamins-Minerals (CENTRUM ADULTS PO) Take 1 tablet by mouth daily.     Marland Kitchen nystatin-triamcinolone ointment (MYCOLOG) Apply 1 application topically 3 (three) times daily. 30 g 1  . OVER THE COUNTER MEDICATION Apply 1 application topically 3 (three) times daily as needed (for boil relief). Boil Relief Ointment    . simvastatin (ZOCOR) 20 MG  tablet Take 20 mg by mouth at bedtime.   0  . traMADol (ULTRAM) 50 MG tablet Take 50 mg by mouth every 6 (six) hours as needed for moderate pain.     . traMADol (ULTRAM) 50 MG tablet Take 1 tablet (50 mg total) by mouth every 6 (six) hours as needed. 20 tablet 0   No current facility-administered medications for this visit.     Review of Systems Review of Systems  Constitutional: Negative.   Respiratory: Negative.   Cardiovascular: Negative.     Blood pressure 130/82, pulse 78, resp. rate 13, height 5\' 7"  (1.702 m), weight 180 lb (81.6 kg).  Physical Exam Physical Exam  Genitourinary:           Assessment    Small focal abscess anterior aspect of recent fistulotomy site, drained.  Resolution of skin inflammation.   Plan  Patient will continue topical care.  Follow-up in 2 weeks, earlier if he does not notice a significant improvement in the perianal discomfort within the next 48 hours.   William Ochoa 01/31/2018, 2:07 PM

## 2018-02-01 ENCOUNTER — Ambulatory Visit: Payer: Self-pay | Admitting: General Surgery

## 2018-02-15 ENCOUNTER — Ambulatory Visit (INDEPENDENT_AMBULATORY_CARE_PROVIDER_SITE_OTHER): Payer: Self-pay | Admitting: General Surgery

## 2018-02-15 ENCOUNTER — Other Ambulatory Visit: Payer: Self-pay

## 2018-02-15 ENCOUNTER — Encounter: Payer: Self-pay | Admitting: General Surgery

## 2018-02-15 VITALS — BP 122/78 | HR 86 | Temp 97.7°F | Resp 18 | Ht 68.0 in | Wt 181.0 lb

## 2018-02-15 DIAGNOSIS — K603 Anal fistula: Secondary | ICD-10-CM

## 2018-02-15 NOTE — Patient Instructions (Addendum)
Patient needs to be scheduled for surgery with Dr.Byrnett for anal fistula. Call the office with any questions or concerns.  The patient is scheduled for surgery at [facility] on 02/23/18 with Dr Bary Castilla. He will pre admit by phone. The patient is aware of date and instructions.

## 2018-02-15 NOTE — Progress Notes (Signed)
Patient ID: William Ochoa, male   DOB: [birth year], 57 y.o.   MRN: [id number]  Chief Complaint  Patient presents with  . Follow-up     2 week f/u s/p anal fistulotomy done 12-30-17    HPI William Ochoa is a 57 y.o. male here to for a  2 week f/u s/p anal fistulotomy done 12-30-17. Patient states he has not had any changes or improvement. Patient would like to discuss surgery.   HPI  Past Medical History:  Diagnosis Date  . GERD (gastroesophageal reflux disease)    rare-no meds  . Glaucoma 2015  . Hepatitis 1970'S   A OR B-PT CANNOT REMEMBER WHICH ONE  . Hyperlipidemia   . Hypothyroidism   . Thyroid disease     Past Surgical History:  Procedure Laterality Date  . ANAL FISTULOTOMY N/A 01/03/2018   Procedure: ANAL FISTULOTOMY;  Surgeon: Robert Bellow, MD;  Location: [facility];  Service: General;  Laterality: N/A;  . ARTHOSCOPIC ROTAOR CUFF REPAIR Right 06/21/2017   Procedure: ARTHROSCOPIC ROTATOR CUFF REPAIR;  Surgeon: Lovell Sheehan, MD;  Location: [facility];  Service: Orthopedics;  Laterality: Right;  . EYE SURGERY Left 2016   CORNEAL SURGERY  . FISTULOTOMY N/A 10/01/2015   Procedure: FISTULOTOMY;  Surgeon: Robert Bellow, MD;  Location: [facility];  Service: General;  Laterality: N/A;  . [place]  . INCISION AND DRAINAGE PERIRECTAL ABSCESS N/A 10/01/2015   Procedure: IRRIGATION AND DEBRIDEMENT PERIRECTAL ABSCESS;  Surgeon: Robert Bellow, MD;  Location: [facility];  Service: General;  Laterality: N/A;  . RESECTION DISTAL CLAVICAL  06/21/2017   Procedure: DISTAL CLAVICALEXCISION;  Surgeon: Lovell Sheehan, MD;  Location: [facility];  Service: Orthopedics;;  . SHOULDER ARTHROSCOPY WITH BICEPSTENOTOMY Right 06/21/2017   Procedure: SHOULDER ARTHROSCOPY WITH BICEPSTENOTOMY;  Surgeon: Lovell Sheehan, MD;  Location: [facility];  Service: Orthopedics;  Laterality: Right;  . SHOULDER ARTHROSCOPY WITH OPEN ROTATOR CUFF REPAIR Right 06/21/2017   Procedure: SHOULDER  ARTHROSCOPY;  Surgeon: Lovell Sheehan, MD;  Location: [facility];  Service: Orthopedics;  Laterality: Right;  . SIGMOIDOSCOPY N/A 10/01/2015   Procedure: SIGMOIDOSCOPY;  Surgeon: Robert Bellow, MD;  Location: [facility];  Service: General;  Laterality: N/A;  . SUBACROMIAL DECOMPRESSION Right 06/21/2017   Procedure: SUBACROMIAL DECOMPRESSION;  Surgeon: Lovell Sheehan, MD;  Location: [facility];  Service: Orthopedics;  Laterality: Right;    Family History  Problem Relation Age of Onset  . Cancer Mother        skin  . Cancer Father        Prostate    Social History Social History   Tobacco Use  . Smoking status: Current Every Day Smoker    Packs/day: 1.00    Years: 30.00    Pack years: 30.00    Types: Cigarettes  . Smokeless tobacco: Never Used  Substance Use Topics  . Alcohol use: No    Alcohol/week: 0.0 standard drinks    Comment: SOBER FOR 25 YEARS  . Drug use: No    Comment: H/O COCAINE 25 YEARS AGO    No Known Allergies  Current Outpatient Medications  Medication Sig Dispense Refill  . Carboxymeth-Glycerin-Polysorb (REFRESH OPTIVE ADVANCED) 0.5-1-0.5 % SOLN Place 1 drop into both eyes 2 (two) times daily as needed (for dry eyes).     Marland Kitchen HYDROcodone-acetaminophen (NORCO/VICODIN) 5-325 MG tablet Take 1-2 tablets by mouth every 4 (four) hours as needed for moderate pain. (Patient not taking: Reported on  02/15/2018) 30 tablet 0  . ibuprofen (ADVIL,MOTRIN) 200 MG tablet Take 200 mg by mouth every 6 (six) hours as needed for headache or moderate pain.    Marland Kitchen levothyroxine (SYNTHROID, LEVOTHROID) 125 MCG tablet Take 125 mcg by mouth daily before breakfast.   0  . Multiple Vitamins-Minerals (CENTRUM ADULTS PO) Take 1 tablet by mouth daily.     Marland Kitchen nystatin-triamcinolone ointment (MYCOLOG) Apply 1 application topically 3 (three) times daily. 30 g 1  . simvastatin (ZOCOR) 20 MG tablet Take 20 mg by mouth at bedtime.   0  . traMADol (ULTRAM) 50 MG tablet Take 1 tablet (50 mg total) by  mouth every 6 (six) hours as needed. 20 tablet 0   No current facility-administered medications for this visit.     Review of Systems Review of Systems  Constitutional: Negative.   Respiratory: Negative.   Cardiovascular: Negative.     Blood pressure 122/78, pulse 86, temperature 97.7 F (36.5 C), temperature source Temporal, resp. rate 18, height 5\' 8"  (1.727 m), weight 181 lb (82.1 kg), SpO2 97 %.  Physical Exam Physical Exam  Constitutional: He is oriented to person, place, and time. He appears well-developed and well-nourished.  Eyes: Conjunctivae are normal. No scleral icterus.  Neck: Normal range of motion.  Cardiovascular: Normal rate, regular rhythm and normal heart sounds.  Pulmonary/Chest: Effort normal and breath sounds normal.  Genitourinary:     Lymphadenopathy:    He has no cervical adenopathy.  Neurological: He is alert and oriented to person, place, and time.  Skin: Skin is warm and dry.    Data Reviewed January 24, 2018 culture:  FEW WBC PRESENT, PREDOMINANTLY PMN  RARE GRAM POSITIVE COCCI IN PAIRS  RARE GRAM NEGATIVE RODS  Assessment    Undrained purulence in the perineum.    Plan Patient needs to be scheduled for surgery with Dr.Oneal Schoenberger for anal fistula. Call the office with any questions or concerns. HPI, Physical Exam, Assessment and Plan have been scribed under the direction and in the presence of Hervey Ard, Md.  Eudelia Bunch R. Bobette Mo, CMA  I have completed the exam and reviewed the above documentation for accuracy and completeness.  I agree with the above.  Haematologist has been used and any errors in dictation or transcription are unintentional.  Hervey Ard, M.D., F.A.C.S.  The patient is scheduled for surgery at [facility] on 02/23/18 with Dr Bary Castilla. He will pre admit by phone. The patient is aware of date and instructions.  Documented by Caryl-Lyn Otis Brace LPN  Forest Gleason Samanta Gal 02/15/2018, 8:12 PM

## 2018-02-18 ENCOUNTER — Other Ambulatory Visit: Payer: Self-pay

## 2018-02-18 ENCOUNTER — Encounter
Admission: RE | Admit: 2018-02-18 | Discharge: 2018-02-18 | Disposition: A | Discharge status: Home / Self Care | Payer: Self-pay | Source: Ambulatory Visit | Attending: General Surgery | Admitting: General Surgery

## 2018-02-18 NOTE — Patient Instructions (Signed)
Your procedure is scheduled on: 02-23-18 Report to Same Day Surgery 2nd floor medical mall [facility] Entrance-take elevator on left to 2nd floor.  Check in with surgery information desk.) To find out your arrival time please call [phone] between 1PM - 3PM on 02-22-18  Remember: Instructions that are not followed completely may result in serious medical risk, up to and including death, or upon the discretion of your surgeon and anesthesiologist your surgery may need to be rescheduled.    _x___ 1. Do not eat food after midnight the night before your procedure. You may drink clear liquids up to 2 hours before you are scheduled to arrive at the hospital for your procedure.  Do not drink clear liquids within 2 hours of your scheduled arrival to the hospital.  Clear liquids include  --Water or Apple juice without pulp  --Clear carbohydrate beverage such as ClearFast or Gatorade  --Black Coffee or Clear Tea (No milk, no creamers, do not add anything to the coffee or Tea   ____Ensure clear carbohydrate drink on the way to the hospital for bariatric patients  ____Ensure clear carbohydrate drink 3 hours before surgery for Dr Dwyane Luo patients if physician instructed.   No gum chewing or hard candies.     __x__ 2. No Alcohol for 24 hours before or after surgery.   __x__3. No Smoking or e-cigarettes for 24 prior to surgery.  Do not use any chewable tobacco products for at least 6 hour prior to surgery   ____  4. Bring all medications with you on the day of surgery if instructed.    __x__ 5. Notify your doctor if there is any change in your medical condition     (cold, fever, infections).    x___6. On the morning of surgery brush your teeth with toothpaste and water.  You may rinse your mouth with mouth wash if you wish.  Do not swallow any toothpaste or mouthwash.   Do not wear jewelry, make-up, hairpins, clips or nail polish.  Do not wear lotions, powders, or perfumes. You may wear  deodorant.  Do not shave 48 hours prior to surgery. Men may shave face and neck.  Do not bring valuables to the hospital.    [facility] is not responsible for any belongings or valuables.               Contacts, dentures or bridgework may not be worn into surgery.  Leave your suitcase in the car. After surgery it may be brought to your room.  For patients admitted to the hospital, discharge time is determined by your treatment team.  _  Patients discharged the day of surgery will not be allowed to drive home.  You will need someone to drive you home and stay with you the night of your procedure.    Please read over the following fact sheets that you were given:   [facility] Preparing for Surgery   _x___ TAKE THE FOLLOWING MEDICATION THE MORNING OF SURGERY WITH A SMALL SIP OF WATER. These include:  1. LEVOTHYROXINE  2.  3.  4.  5.  6.  ____Fleets enema or Magnesium Citrate as directed.   ____ Use CHG Soap or sage wipes as directed on instruction sheet   ____ Use inhalers on the day of surgery and bring to hospital day of surgery  ____ Stop Metformin and Janumet 2 days prior to surgery.    ____ Take 1/2 of usual insulin dose the night before surgery  and none on the morning surgery.   ____ Follow recommendations from Cardiologist, Pulmonologist or PCP regarding stopping Aspirin, Coumadin, Plavix ,Eliquis, Effient, or Pradaxa, and Pletal.  ____Stop Anti-inflammatories such as Advil, Aleve, Ibuprofen, Motrin, Naproxen, Naprosyn, Goodies powders or aspirin products. OK to take Tylenol    ____ Stop supplements until after surgery.     ____ Bring C-Pap to the hospital.

## 2018-02-22 MED ORDER — CEFAZOLIN SODIUM-DEXTROSE 2-4 GM/100ML-% IV SOLN
2.0000 g | INTRAVENOUS | Status: AC
  Administered 2018-02-23: 2 g via INTRAVENOUS

## 2018-02-23 ENCOUNTER — Ambulatory Visit: Payer: Self-pay

## 2018-02-23 ENCOUNTER — Encounter
Admission: RE | Disposition: A | Discharge status: Home / Self Care | Payer: Self-pay | Source: Ambulatory Visit | Attending: General Surgery

## 2018-02-23 ENCOUNTER — Ambulatory Visit
Admission: RE | Admit: 2018-02-23 | Discharge: 2018-02-23 | Disposition: A | Discharge status: Home / Self Care | Payer: Self-pay | Source: Ambulatory Visit | Attending: General Surgery | Admitting: General Surgery

## 2018-02-23 DIAGNOSIS — E785 Hyperlipidemia, unspecified: Secondary | ICD-10-CM | POA: Insufficient documentation

## 2018-02-23 DIAGNOSIS — K219 Gastro-esophageal reflux disease without esophagitis: Secondary | ICD-10-CM | POA: Insufficient documentation

## 2018-02-23 DIAGNOSIS — K610 Anal abscess: Secondary | ICD-10-CM

## 2018-02-23 DIAGNOSIS — E039 Hypothyroidism, unspecified: Secondary | ICD-10-CM | POA: Insufficient documentation

## 2018-02-23 DIAGNOSIS — K603 Anal fistula: Secondary | ICD-10-CM

## 2018-02-23 DIAGNOSIS — L02215 Cutaneous abscess of perineum: Secondary | ICD-10-CM | POA: Insufficient documentation

## 2018-02-23 DIAGNOSIS — Z87891 Personal history of nicotine dependence: Secondary | ICD-10-CM | POA: Insufficient documentation

## 2018-02-23 DIAGNOSIS — Z79899 Other long term (current) drug therapy: Secondary | ICD-10-CM | POA: Insufficient documentation

## 2018-02-23 HISTORY — PX: ANAL FISTULOTOMY: SHX6423

## 2018-02-23 LAB — URINE DRUG SCREEN, QUALITATIVE (ARMC ONLY)
Amphetamines, Ur Screen: NOT DETECTED
Barbiturates, Ur Screen: NOT DETECTED
Benzodiazepine, Ur Scrn: NOT DETECTED
CANNABINOID 50 NG, UR ~~LOC~~: NOT DETECTED
Cocaine Metabolite,Ur ~~LOC~~: NOT DETECTED
MDMA (ECSTASY) UR SCREEN: NOT DETECTED
Methadone Scn, Ur: NOT DETECTED
OPIATE, UR SCREEN: NOT DETECTED
PHENCYCLIDINE (PCP) UR S: NOT DETECTED
TRICYCLIC, UR SCREEN: NOT DETECTED

## 2018-02-23 SURGERY — ANAL FISTULOTOMY
Anesthesia: General | Site: Buttocks

## 2018-02-23 MED ORDER — BUPIVACAINE HCL 0.25 % IJ SOLN
INTRAMUSCULAR | Status: DC | PRN
  Administered 2018-02-23: 30 mL

## 2018-02-23 MED ORDER — SILVER SULFADIAZINE 1 % EX CREA
TOPICAL_CREAM | CUTANEOUS | Status: DC | PRN
  Administered 2018-02-23: 1 via TOPICAL

## 2018-02-23 MED ORDER — ONDANSETRON HCL 4 MG/2ML IJ SOLN
INTRAMUSCULAR | Status: DC | PRN
  Administered 2018-02-23: 4 mg via INTRAVENOUS

## 2018-02-23 MED ORDER — HYDROMORPHONE HCL 1 MG/ML IJ SOLN: 0.2500 mg | INTRAMUSCULAR | Status: DC | PRN

## 2018-02-23 MED ORDER — SILVER SULFADIAZINE 1 % EX CREA: TOPICAL_CREAM | CUTANEOUS | 1 refills | Status: DC

## 2018-02-23 MED ORDER — ACETAMINOPHEN 160 MG/5ML PO SOLN
325.0000 mg | ORAL | Status: DC | PRN
  Filled 2018-02-23: qty 20.3

## 2018-02-23 MED ORDER — HYDROCODONE-ACETAMINOPHEN 5-325 MG PO TABS: 1.0000 | ORAL_TABLET | ORAL | 0 refills | Status: DC | PRN

## 2018-02-23 MED ORDER — BUPIVACAINE HCL (PF) 0.25 % IJ SOLN
INTRAMUSCULAR | Status: AC
  Filled 2018-02-23: qty 30

## 2018-02-23 MED ORDER — ACETAMINOPHEN 10 MG/ML IV SOLN
INTRAVENOUS | Status: AC
  Filled 2018-02-23: qty 100

## 2018-02-23 MED ORDER — PROMETHAZINE HCL 25 MG/ML IJ SOLN: 6.2500 mg | INTRAMUSCULAR | Status: DC | PRN

## 2018-02-23 MED ORDER — FENTANYL CITRATE (PF) 100 MCG/2ML IJ SOLN
INTRAMUSCULAR | Status: DC | PRN
  Administered 2018-02-23 (×4): 25 ug via INTRAVENOUS

## 2018-02-23 MED ORDER — LACTATED RINGERS IV SOLN
INTRAVENOUS | Status: DC
  Administered 2018-02-23: 12:00:00 via INTRAVENOUS

## 2018-02-23 MED ORDER — CELECOXIB 200 MG PO CAPS
ORAL_CAPSULE | ORAL | Status: AC
  Administered 2018-02-23: 200 mg via ORAL
  Filled 2018-02-23: qty 1

## 2018-02-23 MED ORDER — LIDOCAINE HCL (CARDIAC) PF 100 MG/5ML IV SOSY
PREFILLED_SYRINGE | INTRAVENOUS | Status: DC | PRN
  Administered 2018-02-23: 80 mg via INTRAVENOUS

## 2018-02-23 MED ORDER — FAMOTIDINE 20 MG PO TABS
ORAL_TABLET | ORAL | Status: AC
  Administered 2018-02-23: 20 mg via ORAL
  Filled 2018-02-23: qty 1

## 2018-02-23 MED ORDER — BUPIVACAINE LIPOSOME 1.3 % IJ SUSP
INTRAMUSCULAR | Status: AC
  Filled 2018-02-23: qty 20

## 2018-02-23 MED ORDER — ACETAMINOPHEN 325 MG PO TABS: 325.0000 mg | ORAL_TABLET | ORAL | Status: DC | PRN

## 2018-02-23 MED ORDER — MIDAZOLAM HCL 2 MG/2ML IJ SOLN
INTRAMUSCULAR | Status: AC
  Filled 2018-02-23: qty 2

## 2018-02-23 MED ORDER — MEPERIDINE HCL 50 MG/ML IJ SOLN: 6.2500 mg | INTRAMUSCULAR | Status: DC | PRN

## 2018-02-23 MED ORDER — SILVER SULFADIAZINE 1 % EX CREA
TOPICAL_CREAM | CUTANEOUS | Status: AC
  Filled 2018-02-23: qty 85

## 2018-02-23 MED ORDER — BUPIVACAINE-EPINEPHRINE (PF) 0.5% -1:200000 IJ SOLN
INTRAMUSCULAR | Status: AC
  Filled 2018-02-23: qty 30

## 2018-02-23 MED ORDER — FENTANYL CITRATE (PF) 100 MCG/2ML IJ SOLN
INTRAMUSCULAR | Status: AC
  Filled 2018-02-23: qty 2

## 2018-02-23 MED ORDER — METRONIDAZOLE 500 MG PO TABS: 500.0000 mg | ORAL_TABLET | Freq: Three times a day (TID) | ORAL | 0 refills | Status: AC

## 2018-02-23 MED ORDER — HYDROCODONE-ACETAMINOPHEN 7.5-325 MG PO TABS: 1.0000 | ORAL_TABLET | Freq: Once | ORAL | Status: DC | PRN

## 2018-02-23 MED ORDER — MIDAZOLAM HCL 2 MG/2ML IJ SOLN
INTRAMUSCULAR | Status: DC | PRN
  Administered 2018-02-23: 2 mg via INTRAVENOUS

## 2018-02-23 MED ORDER — GABAPENTIN 300 MG PO CAPS
ORAL_CAPSULE | ORAL | Status: AC
  Administered 2018-02-23: 300 mg via ORAL
  Filled 2018-02-23: qty 1

## 2018-02-23 MED ORDER — CELECOXIB 200 MG PO CAPS
200.0000 mg | ORAL_CAPSULE | ORAL | Status: AC
  Administered 2018-02-23: 200 mg via ORAL

## 2018-02-23 MED ORDER — BUPIVACAINE LIPOSOME 1.3 % IJ SUSP
INTRAMUSCULAR | Status: DC | PRN
  Administered 2018-02-23: 20 mL

## 2018-02-23 MED ORDER — ACETAMINOPHEN 10 MG/ML IV SOLN: INTRAVENOUS | Status: DC | PRN

## 2018-02-23 MED ORDER — DEXAMETHASONE SODIUM PHOSPHATE 10 MG/ML IJ SOLN
INTRAMUSCULAR | Status: DC | PRN
  Administered 2018-02-23: 10 mg via INTRAVENOUS

## 2018-02-23 MED ORDER — KETOROLAC TROMETHAMINE 30 MG/ML IJ SOLN
INTRAMUSCULAR | Status: DC | PRN
  Administered 2018-02-23: 30 mg via INTRAVENOUS

## 2018-02-23 MED ORDER — CEFAZOLIN SODIUM-DEXTROSE 2-4 GM/100ML-% IV SOLN
INTRAVENOUS | Status: AC
  Filled 2018-02-23: qty 100

## 2018-02-23 MED ORDER — PROPOFOL 10 MG/ML IV BOLUS
INTRAVENOUS | Status: DC | PRN
  Administered 2018-02-23: 160 mg via INTRAVENOUS
  Administered 2018-02-23: 4 mg via INTRAVENOUS

## 2018-02-23 MED ORDER — EPHEDRINE SULFATE 50 MG/ML IJ SOLN
INTRAMUSCULAR | Status: DC | PRN
  Administered 2018-02-23: 10 mg via INTRAVENOUS

## 2018-02-23 MED ORDER — HYDROCODONE-ACETAMINOPHEN 5-325 MG PO TABS: 1.0000 | ORAL_TABLET | ORAL | Status: DC | PRN

## 2018-02-23 MED ORDER — ACETAMINOPHEN 10 MG/ML IV SOLN
INTRAVENOUS | Status: DC | PRN
  Administered 2018-02-23: 1000 mg via INTRAVENOUS

## 2018-02-23 MED ORDER — GABAPENTIN 300 MG PO CAPS
300.0000 mg | ORAL_CAPSULE | ORAL | Status: AC
  Administered 2018-02-23: 300 mg via ORAL

## 2018-02-23 MED ORDER — PROPOFOL 10 MG/ML IV BOLUS
INTRAVENOUS | Status: AC
  Filled 2018-02-23: qty 20

## 2018-02-23 MED ORDER — FAMOTIDINE 20 MG PO TABS
20.0000 mg | ORAL_TABLET | Freq: Once | ORAL | Status: AC
  Administered 2018-02-23: 20 mg via ORAL

## 2018-02-23 SURGICAL SUPPLY — 29 items
BLADE SURG 15 STRL SS SAFETY (BLADE) ×3 IMPLANT
BRIEF STRETCH MATERNITY 2XLG (MISCELLANEOUS) ×3 IMPLANT
CANISTER SUCT 1200ML W/VALVE (MISCELLANEOUS) ×3 IMPLANT
COVER WAND RF STERILE (DRAPES) ×3 IMPLANT
DRAPE LAPAROTOMY 100X77 ABD (DRAPES) ×3 IMPLANT
DRAPE LEGGINS SURG 28X43 STRL (DRAPES) ×3 IMPLANT
DRAPE UNDER BUTTOCK W/FLU (DRAPES) ×3 IMPLANT
DRSG GAUZE PETRO 6X36 STRIP ST (GAUZE/BANDAGES/DRESSINGS) ×3 IMPLANT
ELECT REM PT RETURN 9FT ADLT (ELECTROSURGICAL) ×3
ELECTRODE REM PT RTRN 9FT ADLT (ELECTROSURGICAL) ×1 IMPLANT
GLOVE BIO SURGEON STRL SZ7.5 (GLOVE) ×3 IMPLANT
GLOVE INDICATOR 8.0 STRL GRN (GLOVE) ×3 IMPLANT
GOWN STRL REUS W/ TWL LRG LVL3 (GOWN DISPOSABLE) ×2 IMPLANT
GOWN STRL REUS W/TWL LRG LVL3 (GOWN DISPOSABLE) ×4
KIT TURNOVER KIT A (KITS) ×3 IMPLANT
LABEL OR SOLS (LABEL) ×3 IMPLANT
NEEDLE HYPO 22GX1.5 SAFETY (NEEDLE) ×3 IMPLANT
NEEDLE HYPO 25X1 1.5 SAFETY (NEEDLE) ×3 IMPLANT
NS IRRIG 500ML POUR BTL (IV SOLUTION) ×3 IMPLANT
PACK BASIN MINOR ARMC (MISCELLANEOUS) ×3 IMPLANT
PAD OB MATERNITY 4.3X12.25 (PERSONAL CARE ITEMS) ×3 IMPLANT
PAD PREP 24X41 OB/GYN DISP (PERSONAL CARE ITEMS) ×3 IMPLANT
SOL PREP PVP 2OZ (MISCELLANEOUS) ×3
SOLUTION PREP PVP 2OZ (MISCELLANEOUS) ×1 IMPLANT
SURGILUBE 2OZ TUBE FLIPTOP (MISCELLANEOUS) ×3 IMPLANT
SUT SILK 0 CT 1 30 (SUTURE) ×3 IMPLANT
SUT VIC AB 3-0 SH 27 (SUTURE) ×2
SUT VIC AB 3-0 SH 27X BRD (SUTURE) ×1 IMPLANT
SYR CONTROL 10ML (SYRINGE) ×3 IMPLANT

## 2018-02-23 NOTE — OR Nursing (Signed)
Dr Amie Critchley aware patient has toast at 0500 this am. Faythe Ghee to go to surgery

## 2018-02-23 NOTE — Anesthesia Preprocedure Evaluation (Signed)
Anesthesia Evaluation  Patient identified by MRN, date of birth, ID band Patient awake    Reviewed: Allergy & Precautions, H&P , NPO status , reviewed documented beta blocker date and time   Airway Mallampati: II  TM Distance: >3 FB Neck ROM: full    Dental  (+) Edentulous Upper, Edentulous Lower   Pulmonary Current Smoker,    Pulmonary exam normal        Cardiovascular Normal cardiovascular exam     Neuro/Psych    GI/Hepatic GERD  Controlled,(+) Hepatitis -  Endo/Other  Hypothyroidism   Renal/GU      Musculoskeletal   Abdominal   Peds  Hematology   Anesthesia Other Findings Past Medical History: No date: GERD (gastroesophageal reflux disease)     Comment:  rare-no meds 2015: Glaucoma 1970'S: Hepatitis     Comment:  A OR B-PT CANNOT REMEMBER WHICH ONE No date: Hyperlipidemia No date: Hypothyroidism No date: Thyroid disease Past Surgical History: 01/03/2018: ANAL FISTULOTOMY; N/A     Comment:  Procedure: ANAL FISTULOTOMY;  Surgeon: Robert Bellow, MD;  Location: [facility];  Service: General;                Laterality: N/A; 06/21/2017: ARTHOSCOPIC ROTAOR CUFF REPAIR; Right     Comment:  Procedure: ARTHROSCOPIC ROTATOR CUFF REPAIR;  Surgeon:               Lovell Sheehan, MD;  Location: [facility];  Service:               Orthopedics;  Laterality: Right; 2016: EYE SURGERY; Left     Comment:  CORNEAL SURGERY 10/01/2015: FISTULOTOMY; N/A     Comment:  Procedure: FISTULOTOMY;  Surgeon: Robert Bellow, MD;              Location: [facility];  Service: General;  Laterality: N/A; 1990: HEMORRHOID SURGERY 10/01/2015: INCISION AND DRAINAGE PERIRECTAL ABSCESS; N/A     Comment:  Procedure: IRRIGATION AND DEBRIDEMENT PERIRECTAL               ABSCESS;  Surgeon: Robert Bellow, MD;  Location: [facility]              [facility];  Service: General;  Laterality: N/A; 06/21/2017: RESECTION DISTAL CLAVICAL     Comment:   Procedure: DISTAL CLAVICALEXCISION;  Surgeon: Lovell Sheehan, MD;  Location: [facility];  Service: Orthopedics;; 06/21/2017: SHOULDER ARTHROSCOPY WITH BICEPSTENOTOMY; Right     Comment:  Procedure: SHOULDER ARTHROSCOPY WITH BICEPSTENOTOMY;                Surgeon: Lovell Sheehan, MD;  Location: [facility];                Service: Orthopedics;  Laterality: Right; 06/21/2017: SHOULDER ARTHROSCOPY WITH OPEN ROTATOR CUFF REPAIR; Right     Comment:  Procedure: SHOULDER ARTHROSCOPY;  Surgeon: Lovell Sheehan, MD;  Location: [facility];  Service: Orthopedics;                Laterality: Right; 10/01/2015: SIGMOIDOSCOPY; N/A     Comment:  Procedure: SIGMOIDOSCOPY;  Surgeon: Robert Bellow,               MD;  Location: [facility];  Service: General;  Laterality:               N/A; 06/21/2017: SUBACROMIAL DECOMPRESSION; Right     Comment:  Procedure: SUBACROMIAL DECOMPRESSION;  Surgeon: Lovell Sheehan, MD;  Location: [facility];  Service: Orthopedics;               Laterality: Right;   Reproductive/Obstetrics                             Anesthesia Physical Anesthesia Plan  ASA: II  Anesthesia Plan: General LMA   Post-op Pain Management:    Induction: Intravenous  PONV Risk Score and Plan: 2 and Ondansetron, Treatment may vary due to age or medical condition and Midazolam  Airway Management Planned: LMA  Additional Equipment:   Intra-op Plan:   Post-operative Plan: Extubation in OR  Informed Consent: I have reviewed the patients History and Physical, chart, labs and discussed the procedure including the risks, benefits and alternatives for the proposed anesthesia with the patient or authorized representative who has indicated his/her understanding and acceptance.   Dental Advisory Given  Plan Discussed with: CRNA  Anesthesia Plan Comments:         Anesthesia Quick Evaluation

## 2018-02-23 NOTE — H&P (Signed)
No change in clinical condition or exam. For I&D of perineal abscess.

## 2018-02-23 NOTE — Anesthesia Procedure Notes (Signed)
Procedure Name: LMA Insertion Date/Time: 02/23/2018 1:36 PM Performed by: Aline Brochure, CRNA Pre-anesthesia Checklist: Patient identified, Emergency Drugs available, Suction available and Patient being monitored Patient Re-evaluated:Patient Re-evaluated prior to induction Oxygen Delivery Method: Circle system utilized Preoxygenation: Pre-oxygenation with 100% oxygen Induction Type: IV induction Ventilation: Mask ventilation without difficulty LMA: LMA inserted LMA Size: 5.0 Number of attempts: 2 Placement Confirmation: positive ETCO2 and breath sounds checked- equal and bilateral Tube secured with: Tape Dental Injury: Teeth and Oropharynx as per pre-operative assessment

## 2018-02-23 NOTE — Transfer of Care (Signed)
Immediate Anesthesia Transfer of Care Note  Patient: William Ochoa  Procedure(s) Performed: ANAL FISTULOTOMY (N/A Buttocks)  Patient Location: PACU  Anesthesia Type:General  Level of Consciousness: drowsy  Airway & Oxygen Therapy: Patient Spontanous Breathing and Patient connected to face mask oxygen  Post-op Assessment: Report given to RN and Post -op Vital signs reviewed and stable  Post vital signs: Reviewed and stable  Last Vitals:  Vitals Value Taken Time  BP 90/58 02/23/2018  2:20 PM  Temp 36.3 C 02/23/2018  2:20 PM  Pulse 62 02/23/2018  2:21 PM  Resp 10 02/23/2018  2:21 PM  SpO2 98 % 02/23/2018  2:21 PM  Vitals shown include unvalidated device data.  Last Pain:  Vitals:   02/23/18 1201  TempSrc: Temporal  PainSc: 5          Complications: No apparent anesthesia complications

## 2018-02-23 NOTE — Discharge Instructions (Signed)

## 2018-02-23 NOTE — OR Nursing (Signed)
Discharge instructions discussed with pt and friend. Both voice understanding. 

## 2018-02-23 NOTE — Anesthesia Post-op Follow-up Note (Signed)
Anesthesia QCDR form completed.        

## 2018-02-23 NOTE — Op Note (Signed)
Preoperative gnosis: Recurrent perineal abscess.  Postoperative diagnosis: Same.  Operative procedure: Debridement of perineal abscess.  Operating Surgeon: Hervey Ard, MD.  Anesthesia: General by LMA, Exparel: 20 cc; Marcaine 0.25%, plain: 30 cc.  Estimated blood loss: Less than 10 cc.  Clinical note: This 57 year old male has previously undergone incision and drainage of a fistula in anal.  He is developed an abscess between the rectum and the base of the scrotum.  Is brought to the operating at this time for operative debridement.  Operative note: The patient underwent general endotracheal anesthesia and tolerated this well.  He was placed in dorsolithotomy position.  The perineum was prepped with Betadine solution and draped.  Local anesthetic was infiltrated for postoperative analgesia.  The fistulous site on the left side of the midline was cannulated with a lacrimal duct probe and this extended down to the original fistulotomy site.  This was incised sharply and dissected with electrocautery.  A secondary branch crossing the midline with a 5 mm pocket of chronic granulation tissue was identified.  This entire area was debrided.  The eventual defect measured 3 x 5 cm.  Hemostasis was with electrocautery.  The area between the edge of this debridement site and the anus had healed completely and was not reopened.  Digital rectal exam was unremarkable.  Silvadene cream was applied to the debridement site followed by Telfa and ABD pad.  Patient tolerated the procedure well and was taken to recovery room in stable condition.

## 2018-02-23 NOTE — Anesthesia Postprocedure Evaluation (Signed)
Anesthesia Post Note  Patient: William Ochoa  Procedure(s) Performed: ANAL FISTULOTOMY (N/A Buttocks)  Patient location during evaluation: PACU Anesthesia Type: General Level of consciousness: awake and alert Pain management: pain level controlled Vital Signs Assessment: post-procedure vital signs reviewed and stable Respiratory status: spontaneous breathing, nonlabored ventilation, respiratory function stable and patient connected to nasal cannula oxygen Cardiovascular status: blood pressure returned to baseline and stable Postop Assessment: no apparent nausea or vomiting Anesthetic complications: no     Last Vitals:  Vitals:   02/23/18 1512 02/23/18 1542  BP: 112/72 116/70  Pulse: 64 61  Resp: 18 17  Temp: 36.6 C   SpO2: 97% 98%    Last Pain:  Vitals:   02/23/18 1512  TempSrc:   PainSc: 0-No pain                 Antony Sian S

## 2018-02-24 ENCOUNTER — Encounter: Payer: Self-pay | Admitting: General Surgery

## 2018-02-25 LAB — SURGICAL PATHOLOGY

## 2018-03-08 ENCOUNTER — Encounter: Payer: Self-pay | Admitting: General Surgery

## 2018-03-08 ENCOUNTER — Ambulatory Visit (INDEPENDENT_AMBULATORY_CARE_PROVIDER_SITE_OTHER): Payer: Self-pay | Admitting: General Surgery

## 2018-03-08 ENCOUNTER — Other Ambulatory Visit: Payer: Self-pay

## 2018-03-08 ENCOUNTER — Telehealth: Payer: Self-pay | Admitting: *Deleted

## 2018-03-08 VITALS — BP 111/75 | HR 72 | Temp 97.7°F | Resp 16 | Ht 68.0 in | Wt 181.2 lb

## 2018-03-08 DIAGNOSIS — K603 Anal fistula: Secondary | ICD-10-CM

## 2018-03-08 NOTE — Progress Notes (Signed)
Patient ID: William Ochoa, male   DOB: [birth year], 57 y.o.   MRN: [id number]  Chief Complaint  Patient presents with  . Routine Post Op    anal fistulotomy    HPI William Ochoa is a 57 y.o. male.  Here today for post operative care for anal fistulotomy. Patient states he is better and continues to use the cream.  HPI  Past Medical History:  Diagnosis Date  . GERD (gastroesophageal reflux disease)    rare-no meds  . Glaucoma 2015  . Hepatitis 1970'S   A OR B-PT CANNOT REMEMBER WHICH ONE  . Hyperlipidemia   . Hypothyroidism   . Thyroid disease     Past Surgical History:  Procedure Laterality Date  . ANAL FISTULOTOMY N/A 01/03/2018   Procedure: ANAL FISTULOTOMY;  Surgeon: Robert Bellow, MD;  Location: [facility];  Service: General;  Laterality: N/A;  . ANAL FISTULOTOMY N/A 02/23/2018   Procedure: ANAL FISTULOTOMY;  Surgeon: Robert Bellow, MD;  Location: [facility];  Service: General;  Laterality: N/A;  . ARTHOSCOPIC ROTAOR CUFF REPAIR Right 06/21/2017   Procedure: ARTHROSCOPIC ROTATOR CUFF REPAIR;  Surgeon: Lovell Sheehan, MD;  Location: [facility];  Service: Orthopedics;  Laterality: Right;  . EYE SURGERY Left 2016   CORNEAL SURGERY  . FISTULOTOMY N/A 10/01/2015   Procedure: FISTULOTOMY;  Surgeon: Robert Bellow, MD;  Location: [facility];  Service: General;  Laterality: N/A;  . William Ochoa  . INCISION AND DRAINAGE PERIRECTAL ABSCESS N/A 10/01/2015   Procedure: IRRIGATION AND DEBRIDEMENT PERIRECTAL ABSCESS;  Surgeon: Robert Bellow, MD;  Location: [facility];  Service: General;  Laterality: N/A;  . RESECTION DISTAL CLAVICAL  06/21/2017   Procedure: DISTAL CLAVICALEXCISION;  Surgeon: Lovell Sheehan, MD;  Location: [facility];  Service: Orthopedics;;  . SHOULDER ARTHROSCOPY WITH BICEPSTENOTOMY Right 06/21/2017   Procedure: SHOULDER ARTHROSCOPY WITH BICEPSTENOTOMY;  Surgeon: Lovell Sheehan, MD;  Location: [facility];  Service: Orthopedics;  Laterality: Right;   . SHOULDER ARTHROSCOPY WITH OPEN ROTATOR CUFF REPAIR Right 06/21/2017   Procedure: SHOULDER ARTHROSCOPY;  Surgeon: Lovell Sheehan, MD;  Location: [facility];  Service: Orthopedics;  Laterality: Right;  . SIGMOIDOSCOPY N/A 10/01/2015   Procedure: SIGMOIDOSCOPY;  Surgeon: Robert Bellow, MD;  Location: [facility];  Service: General;  Laterality: N/A;  . SUBACROMIAL DECOMPRESSION Right 06/21/2017   Procedure: SUBACROMIAL DECOMPRESSION;  Surgeon: Lovell Sheehan, MD;  Location: [facility];  Service: Orthopedics;  Laterality: Right;    Family History  Problem Relation Age of Onset  . Cancer Mother        skin  . Cancer Father        Prostate    Social History Social History   Tobacco Use  . Smoking status: Current Every Day Smoker    Packs/day: 1.00    Years: 30.00    Pack years: 30.00    Types: Cigarettes  . Smokeless tobacco: Never Used  Substance Use Topics  . Alcohol use: No    Alcohol/week: 0.0 standard drinks    Comment: SOBER FOR 25 YEARS  . Drug use: No    Comment: H/O COCAINE 25 YEARS AGO    No Known Allergies  Current Outpatient Medications  Medication Sig Dispense Refill  . Carboxymeth-Glycerin-Polysorb (REFRESH OPTIVE ADVANCED) 0.5-1-0.5 % SOLN Place 1 drop into both eyes 2 (two) times daily as needed (for dry eyes).     Marland Kitchen HYDROcodone-acetaminophen (NORCO/VICODIN) 5-325 MG tablet Take 1 tablet by mouth every  4 (four) hours as needed for moderate pain. 30 tablet 0  . ibuprofen (ADVIL,MOTRIN) 200 MG tablet Take 200 mg by mouth every 6 (six) hours as needed for headache or moderate pain.    Marland Kitchen levothyroxine (SYNTHROID, LEVOTHROID) 125 MCG tablet Take 125 mcg by mouth daily before breakfast.   0  . Multiple Vitamins-Minerals (CENTRUM ADULTS PO) Take 1 tablet by mouth daily.     Marland Kitchen nystatin-triamcinolone ointment (MYCOLOG) Apply 1 application topically 3 (three) times daily. 30 g 1  . silver sulfADIAZINE (SILVADENE) 1 % cream Apply to affected area 3 times per day 400 g 1  .  simvastatin (ZOCOR) 20 MG tablet Take 20 mg by mouth at bedtime.   0   No current facility-administered medications for this visit.     Review of Systems Review of Systems  Constitutional: Negative.   Respiratory: Negative.   Skin: Negative.     Blood pressure 111/75, pulse 72, temperature 97.7 F (36.5 C), temperature source Temporal, resp. rate 16, height 5\' 8"  (1.727 m), weight 181 lb 3.2 oz (82.2 kg), SpO2 94 %.  Physical Exam Physical Exam  Constitutional: He appears well-developed and well-nourished.  Cardiovascular: Normal rate.  Pulmonary/Chest: Effort normal.  Genitourinary:     Skin: Skin is warm and dry.    Data Reviewed DIAGNOSIS:  A. SKIN AND SOFT TISSUE, PERINEUM; EXCISION:  - CHRONIC ABSCESS/TRACT, PARTLY LINED BY SQUAMOUS EPITHELIUM, AND  ADJACENT SCARRING.  - NO GRANULOMAS SEEN.  - NEGATIVE FOR ATYPIA AND MALIGNANCY.   Assessment    Steady improvement post debridement left peroneal fistula.    Plan    Follow up in one month.      HPI, Physical Exam, Assessment and Plan have been scribed under the direction and in the presence of Robert Bellow, MD. William Ochoa, CMA  I have completed the exam and reviewed the above documentation for accuracy and completeness.  I agree with the above.  Haematologist has been used and any errors in dictation or transcription are unintentional.  Hervey Ard, M.D., F.A.C.S.  William Ochoa 03/08/2018, 1:16 PM

## 2018-03-08 NOTE — Telephone Encounter (Signed)
Patient called and stated that he thinks it would be good to take another round of antibiotics. Patient did not know what the name of it was and he stated that he didn't have the bottle anymore.

## 2018-03-08 NOTE — Patient Instructions (Signed)
Follow up in one month.

## 2018-03-09 NOTE — Telephone Encounter (Signed)
I left a message for the patient that at this time I did not see any indication for additional oral antibiotics.  The superficial wound is healing well and there is no evidence of deep inflammation.  The risks of diarrhea with delayed wound healing were mentioned in the phone call.

## 2018-04-11 ENCOUNTER — Encounter: Payer: Self-pay | Admitting: Surgery

## 2018-04-11 ENCOUNTER — Other Ambulatory Visit: Payer: Self-pay

## 2018-04-11 ENCOUNTER — Ambulatory Visit (INDEPENDENT_AMBULATORY_CARE_PROVIDER_SITE_OTHER): Payer: Self-pay | Admitting: Surgery

## 2018-04-11 VITALS — BP 91/66 | HR 90 | Temp 97.9°F | Resp 13 | Ht 67.0 in | Wt 182.0 lb

## 2018-04-11 DIAGNOSIS — K603 Anal fistula: Secondary | ICD-10-CM

## 2018-04-11 MED ORDER — METRONIDAZOLE 500 MG PO TABS: 500.0000 mg | ORAL_TABLET | Freq: Two times a day (BID) | ORAL | 0 refills | Status: AC

## 2018-04-11 MED ORDER — CIPROFLOXACIN HCL 500 MG PO TABS: 500.0000 mg | ORAL_TABLET | Freq: Two times a day (BID) | ORAL | 0 refills | Status: AC

## 2018-04-11 NOTE — Patient Instructions (Addendum)
Return in one month with Dr. Bary Castilla. The patient is aware to call back for any questions or concerns.

## 2018-04-12 ENCOUNTER — Ambulatory Visit: Payer: Self-pay | Admitting: General Surgery

## 2018-04-13 NOTE — Progress Notes (Signed)
Outpatient Surgical Follow Up  04/13/2018  William Ochoa is an 58 y.o. male.   Chief Complaint  Patient presents with  . Routine Post Op    HPI: s/p debridement perineal abscess and fistulotomy by Dr. Bary Castilla 11/20, continue to have intermittent pain, moderate and sharp perineal area. No fevers or chills. Continues to smoke heavily. No drainage  Past Medical History:  Diagnosis Date  . GERD (gastroesophageal reflux disease)    rare-no meds  . Glaucoma 2015  . Hepatitis 1970'S   A OR B-PT CANNOT REMEMBER WHICH ONE  . Hyperlipidemia   . Hypothyroidism   . Thyroid disease     Past Surgical History:  Procedure Laterality Date  . ANAL FISTULOTOMY N/A 01/03/2018   Procedure: ANAL FISTULOTOMY;  Surgeon: Robert Bellow, MD;  Location: [facility];  Service: General;  Laterality: N/A;  . ANAL FISTULOTOMY N/A 02/23/2018   Procedure: ANAL FISTULOTOMY;  Surgeon: Robert Bellow, MD;  Location: [facility];  Service: General;  Laterality: N/A;  . ARTHOSCOPIC ROTAOR CUFF REPAIR Right 06/21/2017   Procedure: ARTHROSCOPIC ROTATOR CUFF REPAIR;  Surgeon: Lovell Sheehan, MD;  Location: [facility];  Service: Orthopedics;  Laterality: Right;  . EYE SURGERY Left 2016   CORNEAL SURGERY  . FISTULOTOMY N/A 10/01/2015   Procedure: FISTULOTOMY;  Surgeon: Robert Bellow, MD;  Location: [facility];  Service: General;  Laterality: N/A;  . [place]  . INCISION AND DRAINAGE PERIRECTAL ABSCESS N/A 10/01/2015   Procedure: IRRIGATION AND DEBRIDEMENT PERIRECTAL ABSCESS;  Surgeon: Robert Bellow, MD;  Location: [facility];  Service: General;  Laterality: N/A;  . RESECTION DISTAL CLAVICAL  06/21/2017   Procedure: DISTAL CLAVICALEXCISION;  Surgeon: Lovell Sheehan, MD;  Location: [facility];  Service: Orthopedics;;  . SHOULDER ARTHROSCOPY WITH BICEPSTENOTOMY Right 06/21/2017   Procedure: SHOULDER ARTHROSCOPY WITH BICEPSTENOTOMY;  Surgeon: Lovell Sheehan, MD;  Location: [facility];  Service:  Orthopedics;  Laterality: Right;  . SHOULDER ARTHROSCOPY WITH OPEN ROTATOR CUFF REPAIR Right 06/21/2017   Procedure: SHOULDER ARTHROSCOPY;  Surgeon: Lovell Sheehan, MD;  Location: [facility];  Service: Orthopedics;  Laterality: Right;  . SIGMOIDOSCOPY N/A 10/01/2015   Procedure: SIGMOIDOSCOPY;  Surgeon: Robert Bellow, MD;  Location: [facility];  Service: General;  Laterality: N/A;  . SUBACROMIAL DECOMPRESSION Right 06/21/2017   Procedure: SUBACROMIAL DECOMPRESSION;  Surgeon: Lovell Sheehan, MD;  Location: [facility];  Service: Orthopedics;  Laterality: Right;    Family History  Problem Relation Age of Onset  . Cancer Mother        skin  . Cancer Father        Prostate    Social History:  reports that he has been smoking cigarettes. He has a 30.00 pack-year smoking history. He has never used smokeless tobacco. He reports that he does not drink alcohol or use drugs.  Allergies: No Known Allergies  Medications reviewed.    ROS Full ROS performed and is otherwise negative other than what is stated in HPI   BP 91/66   Pulse 90   Temp 97.9 F (36.6 C) (Skin)   Resp 13   Ht 5\' 7"  (1.702 m)   Wt 182 lb (82.6 kg)   SpO2 98%   BMI 28.51 kg/m   Physical Exam NAD, alert Perianal area with chronic inflammation and induration. There is some tenderness to palpation diffusely but no evidence of abscess or necrotizing infection, some blanching erythema   Assessment/Plan: Chronic perianal infection/fistula. No obvious  evidence of necrotizing infection or abscess that mandates surgical intervention D/W him about the importance of smoking cessation  We will prescribe antibiotics since I do think there is some cellulitis and some chronic infection. Another possibility would be Chron's given the chronicity but his clinical picture goes against it. May f/u w Dr. Fleet Contras in 3-4 weeks  Greater than 50% of the 15 minutes  visit was spent in counseling/coordination of care   Caroleen Hamman,  MD [place]

## 2018-05-17 ENCOUNTER — Other Ambulatory Visit: Payer: Self-pay

## 2018-05-17 ENCOUNTER — Ambulatory Visit (INDEPENDENT_AMBULATORY_CARE_PROVIDER_SITE_OTHER): Payer: Self-pay | Admitting: General Surgery

## 2018-05-17 ENCOUNTER — Encounter: Payer: Self-pay | Admitting: General Surgery

## 2018-05-17 VITALS — BP 124/73 | HR 70 | Temp 97.9°F | Resp 18 | Ht 68.0 in | Wt 185.0 lb

## 2018-05-17 DIAGNOSIS — K603 Anal fistula: Secondary | ICD-10-CM

## 2018-05-17 MED ORDER — DOXYCYCLINE HYCLATE 100 MG PO TABS: 100.0000 mg | ORAL_TABLET | Freq: Two times a day (BID) | ORAL | Status: DC

## 2018-05-17 NOTE — Progress Notes (Signed)
Patient ID: William Ochoa, male   DOB: 1961/03/04, 58 y.o.   MRN: [id number]  Chief Complaint  Patient presents with  . Routine Post Op    Anal fistulotomy    HPI William Ochoa is a 58 y.o. male.  Here for postoperative visit, anal fistulotomy on 02-23-18. He completed the antibiotics couple weeks ago. He feels like the area is "irritated" again. No bleeding. Bowels move twice a day.  HPI  Past Medical History:  Diagnosis Date  . GERD (gastroesophageal reflux disease)    rare-no meds  . Glaucoma 2015  . Hepatitis 1970'S   A OR B-PT CANNOT REMEMBER WHICH ONE  . Hyperlipidemia   . Hypothyroidism   . Thyroid disease     Past Surgical History:  Procedure Laterality Date  . ANAL FISTULOTOMY N/A 01/03/2018   Procedure: ANAL FISTULOTOMY;  Surgeon: Robert Bellow, MD;  Location: [facility];  Service: General;  Laterality: N/A;  . ANAL FISTULOTOMY N/A 02/23/2018   Procedure: ANAL FISTULOTOMY;  Surgeon: Robert Bellow, MD;  Location: [facility];  Service: General;  Laterality: N/A;  . ARTHOSCOPIC ROTAOR CUFF REPAIR Right 06/21/2017   Procedure: ARTHROSCOPIC ROTATOR CUFF REPAIR;  Surgeon: Lovell Sheehan, MD;  Location: [facility];  Service: Orthopedics;  Laterality: Right;  . EYE SURGERY Left 2016   CORNEAL SURGERY  . FISTULOTOMY N/A 10/01/2015   Procedure: FISTULOTOMY;  Surgeon: Robert Bellow, MD;  Location: [facility];  Service: General;  Laterality: N/A;  . [place]  . INCISION AND DRAINAGE PERIRECTAL ABSCESS N/A 10/01/2015   Procedure: IRRIGATION AND DEBRIDEMENT PERIRECTAL ABSCESS;  Surgeon: Robert Bellow, MD;  Location: [facility];  Service: General;  Laterality: N/A;  . RESECTION DISTAL CLAVICAL  06/21/2017   Procedure: DISTAL CLAVICALEXCISION;  Surgeon: Lovell Sheehan, MD;  Location: [facility];  Service: Orthopedics;;  . SHOULDER ARTHROSCOPY WITH BICEPSTENOTOMY Right 06/21/2017   Procedure: SHOULDER ARTHROSCOPY WITH BICEPSTENOTOMY;  Surgeon: Lovell Sheehan, MD;  Location: [facility];  Service: Orthopedics;  Laterality: Right;  . SHOULDER ARTHROSCOPY WITH OPEN ROTATOR CUFF REPAIR Right 06/21/2017   Procedure: SHOULDER ARTHROSCOPY;  Surgeon: Lovell Sheehan, MD;  Location: [facility];  Service: Orthopedics;  Laterality: Right;  . SIGMOIDOSCOPY N/A 10/01/2015   Procedure: SIGMOIDOSCOPY;  Surgeon: Robert Bellow, MD;  Location: [facility];  Service: General;  Laterality: N/A;  . SUBACROMIAL DECOMPRESSION Right 06/21/2017   Procedure: SUBACROMIAL DECOMPRESSION;  Surgeon: Lovell Sheehan, MD;  Location: [facility];  Service: Orthopedics;  Laterality: Right;    Family History  Problem Relation Age of Onset  . Cancer Mother        skin  . Cancer Father        Prostate    Social History Social History   Tobacco Use  . Smoking status: Current Every Day Smoker    Packs/day: 1.00    Years: 30.00    Pack years: 30.00    Types: Cigarettes  . Smokeless tobacco: Never Used  Substance Use Topics  . Alcohol use: No    Alcohol/week: 0.0 standard drinks    Comment: SOBER FOR 25 YEARS  . Drug use: No    Comment: H/O COCAINE 25 YEARS AGO    No Known Allergies  Current Outpatient Medications  Medication Sig Dispense Refill  . Carboxymeth-Glycerin-Polysorb (REFRESH OPTIVE ADVANCED) 0.5-1-0.5 % SOLN Place 1 drop into both eyes 2 (two) times daily as needed (for dry eyes).     Marland Kitchen ibuprofen (  ADVIL,MOTRIN) 200 MG tablet Take 200 mg by mouth every 6 (six) hours as needed for headache or moderate pain.    Marland Kitchen levothyroxine (SYNTHROID, LEVOTHROID) 125 MCG tablet Take 125 mcg by mouth daily before breakfast.   0  . Multiple Vitamins-Minerals (CENTRUM ADULTS PO) Take 1 tablet by mouth daily.     Marland Kitchen nystatin-triamcinolone ointment (MYCOLOG) Apply 1 application topically 3 (three) times daily. 30 g 1  . silver sulfADIAZINE (SILVADENE) 1 % cream Apply to affected area 3 times per day 400 g 1  . simvastatin (ZOCOR) 20 MG tablet Take 20 mg by mouth at bedtime.   0   . doxycycline (VIBRAMYCIN) 100 MG capsule Take 1 capsule (100 mg total) by mouth 2 (two) times daily for 30 days. 60 capsule 0   No current facility-administered medications for this visit.     Review of Systems Review of Systems  Constitutional: Negative.   Respiratory: Negative.   Cardiovascular: Negative.   Gastrointestinal:       The patient reports 1-2 formed stools daily.  No history of mucus, slime or diarrhea.    Blood pressure 124/73, pulse 70, temperature 97.9 F (36.6 C), temperature source Skin, resp. rate 18, height 5\' 8"  (1.727 m), weight 185 lb (83.9 kg), SpO2 93 %.  Physical Exam Physical Exam Constitutional:      Appearance: Normal appearance.  Genitourinary:    Rectum: Normal.    Skin:    General: Skin is warm and dry.  Neurological:     Mental Status: He is alert and oriented to person, place, and time.  Psychiatric:        Mood and Affect: Mood normal.        Behavior: Behavior normal.     Data Reviewed Rectal exam was undertaken with minimal discomfort.  Levators normal.  No evidence of induration or thickening between the levators and the overlying skin.  It appears that the inflammatory process is confined to the superficial tissue layers.  Assessment    Recurrent inflammation without clear evidence of abscess.    Plan    The patient reported improvement on antibiotic schedule provided late last year.  Will make use of a long course of doxycycline 100 mg p.o. twice daily for 1 month with a follow-up in 3 weeks to assess his progress.      HPI, assessment, plan and physical exam has been scribed under the direction and in the presence of Robert Bellow, MD. Karie Fetch, RN   I have completed the exam and reviewed the above documentation for accuracy and completeness.  I agree with the above.  Haematologist has been used and any errors in dictation or transcription are unintentional.  Hervey Ard, M.D., F.A.C.S.  Forest Gleason  Komal Stangelo 05/18/2018, 7:14 PM

## 2018-05-18 ENCOUNTER — Telehealth: Payer: Self-pay | Admitting: *Deleted

## 2018-05-18 MED ORDER — DOXYCYCLINE HYCLATE 100 MG PO CAPS: 100.0000 mg | ORAL_CAPSULE | Freq: Two times a day (BID) | ORAL | 0 refills | Status: AC

## 2018-05-18 NOTE — Telephone Encounter (Signed)
Patient called the office stating that he is at [facility] but they don't have prescription that was supposed to be sent in yesterday.   I did speak with Dr. Bary Castilla and he was notified. Not sure what happened but Dr. Bary Castilla will send now.   Patient aware. He verbalizes understanding and states he will wait at the pharmacy so they can fill prescription while he is there.

## 2018-06-07 ENCOUNTER — Ambulatory Visit (INDEPENDENT_AMBULATORY_CARE_PROVIDER_SITE_OTHER): Payer: Self-pay | Admitting: General Surgery

## 2018-06-07 ENCOUNTER — Other Ambulatory Visit: Payer: Self-pay

## 2018-06-07 ENCOUNTER — Encounter: Payer: Self-pay | Admitting: General Surgery

## 2018-06-07 VITALS — BP 122/82 | HR 80 | Temp 97.7°F | Resp 13 | Ht 68.0 in | Wt 185.0 lb

## 2018-06-07 DIAGNOSIS — K611 Rectal abscess: Secondary | ICD-10-CM

## 2018-06-07 NOTE — Progress Notes (Signed)
Patient ID: William Ochoa, male   DOB: [birth year], 58 y.o.   MRN: [id number]  Chief Complaint  Patient presents with  . Routine Post Op    HPI William Ochoa is a 58 y.o. male here today for his follow up fistulotomy . He feels like the area is "irritated" again. No bleeding.  Minimal improvement with recent course of antibiotics.  HPI  Past Medical History:  Diagnosis Date  . GERD (gastroesophageal reflux disease)    rare-no meds  . Glaucoma 2015  . Hepatitis 1970'S   A OR B-PT CANNOT REMEMBER WHICH ONE  . Hyperlipidemia   . Hypothyroidism   . Thyroid disease     Past Surgical History:  Procedure Laterality Date  . ANAL FISTULOTOMY N/A 01/03/2018   Procedure: ANAL FISTULOTOMY;  Surgeon: Robert Bellow, MD;  Location: [facility];  Service: General;  Laterality: N/A;  . ANAL FISTULOTOMY N/A 02/23/2018   Procedure: ANAL FISTULOTOMY;  Surgeon: Robert Bellow, MD;  Location: [facility];  Service: General;  Laterality: N/A;  . ARTHOSCOPIC ROTAOR CUFF REPAIR Right 06/21/2017   Procedure: ARTHROSCOPIC ROTATOR CUFF REPAIR;  Surgeon: Lovell Sheehan, MD;  Location: [facility];  Service: Orthopedics;  Laterality: Right;  . EYE SURGERY Left 2016   CORNEAL SURGERY  . FISTULOTOMY N/A 10/01/2015   Procedure: FISTULOTOMY;  Surgeon: Robert Bellow, MD;  Location: [facility];  Service: General;  Laterality: N/A;  . [place]  . INCISION AND DRAINAGE PERIRECTAL ABSCESS N/A 10/01/2015   Procedure: IRRIGATION AND DEBRIDEMENT PERIRECTAL ABSCESS;  Surgeon: Robert Bellow, MD;  Location: [facility];  Service: General;  Laterality: N/A;  . RESECTION DISTAL CLAVICAL  06/21/2017   Procedure: DISTAL CLAVICALEXCISION;  Surgeon: Lovell Sheehan, MD;  Location: [facility];  Service: Orthopedics;;  . SHOULDER ARTHROSCOPY WITH BICEPSTENOTOMY Right 06/21/2017   Procedure: SHOULDER ARTHROSCOPY WITH BICEPSTENOTOMY;  Surgeon: Lovell Sheehan, MD;  Location: [facility];  Service: Orthopedics;   Laterality: Right;  . SHOULDER ARTHROSCOPY WITH OPEN ROTATOR CUFF REPAIR Right 06/21/2017   Procedure: SHOULDER ARTHROSCOPY;  Surgeon: Lovell Sheehan, MD;  Location: [facility];  Service: Orthopedics;  Laterality: Right;  . SIGMOIDOSCOPY N/A 10/01/2015   Procedure: SIGMOIDOSCOPY;  Surgeon: Robert Bellow, MD;  Location: [facility];  Service: General;  Laterality: N/A;  . SUBACROMIAL DECOMPRESSION Right 06/21/2017   Procedure: SUBACROMIAL DECOMPRESSION;  Surgeon: Lovell Sheehan, MD;  Location: [facility];  Service: Orthopedics;  Laterality: Right;    Family History  Problem Relation Age of Onset  . Cancer Mother        skin  . Cancer Father        Prostate    Social History Social History   Tobacco Use  . Smoking status: Current Every Day Smoker    Packs/day: 1.00    Years: 30.00    Pack years: 30.00    Types: Cigarettes  . Smokeless tobacco: Never Used  Substance Use Topics  . Alcohol use: No    Alcohol/week: 0.0 standard drinks    Comment: SOBER FOR 25 YEARS  . Drug use: No    Comment: H/O COCAINE 25 YEARS AGO    No Known Allergies  Current Outpatient Medications  Medication Sig Dispense Refill  . Carboxymeth-Glycerin-Polysorb (REFRESH OPTIVE ADVANCED) 0.5-1-0.5 % SOLN Place 1 drop into both eyes 2 (two) times daily as needed (for dry eyes).     Marland Kitchen doxycycline (VIBRAMYCIN) 100 MG capsule Take 1 capsule (100 mg total)  by mouth 2 (two) times daily for 30 days. 60 capsule 0  . ibuprofen (ADVIL,MOTRIN) 200 MG tablet Take 200 mg by mouth every 6 (six) hours as needed for headache or moderate pain.    Marland Kitchen levothyroxine (SYNTHROID, LEVOTHROID) 125 MCG tablet Take 125 mcg by mouth daily before breakfast.   0  . Multiple Vitamins-Minerals (CENTRUM ADULTS PO) Take 1 tablet by mouth daily.     Marland Kitchen nystatin-triamcinolone ointment (MYCOLOG) Apply 1 application topically 3 (three) times daily. 30 g 1  . silver sulfADIAZINE (SILVADENE) 1 % cream Apply to affected area 3 times per day 400 g 1   . simvastatin (ZOCOR) 20 MG tablet Take 20 mg by mouth at bedtime.   0   No current facility-administered medications for this visit.     Review of Systems Review of Systems  Constitutional: Negative.   Respiratory: Negative.   Cardiovascular: Negative.     Blood pressure 122/82, pulse 80, temperature 97.7 F (36.5 C), temperature source Skin, resp. rate 13, height 5\' 8"  (1.727 m), weight 185 lb (83.9 kg), SpO2 97 %.  Physical Exam Physical Exam Constitutional:      Appearance: Normal appearance.  Eyes:     General: No scleral icterus.    Pupils: Pupils are equal, round, and reactive to light.  Neck:     Musculoskeletal: Neck supple.  Cardiovascular:     Rate and Rhythm: Normal rate and regular rhythm.  Pulmonary:     Effort: Pulmonary effort is normal.     Breath sounds: Normal breath sounds.  Genitourinary:   Skin:    General: Skin is warm and dry.  Neurological:     Mental Status: He is alert and oriented to person, place, and time.     Data Reviewed No new data.  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  Plan  We will arrange for the patient to have a pelvic MRI to look for a deep-seated source.  If none is identified we will plan for return to the OR.. The patient is aware to call back for any questions or concerns.   HPI, Physical Exam, Assessment and Plan have been scribed under the direction and in the presence of Hervey Ard, MD.  Gaspar Cola, CMA  I have completed the exam and reviewed the above documentation for accuracy and completeness.  I agree with the above.  Haematologist has been used and any errors in dictation or transcription are unintentional.  Hervey Ard, M.D., F.A.C.S. The patient is scheduled for a pelvic MRI at [facility] on 06/14/18. He will arrive there by 7:30 am and report to the Registration desk in the [place].  Documented by Caryl-Lyn Otis Brace LPN  [name] 06/07/2018, 9:31  PM

## 2018-06-07 NOTE — Patient Instructions (Addendum)
Patient to have a MRI. The patient is aware to call back for any questions or concerns.  The patient is scheduled for a pelvic MRI at [facility] on 06/14/18. He will arrive there by 7:30 am and report to the Registration desk in the [place].

## 2018-06-14 ENCOUNTER — Other Ambulatory Visit: Payer: Self-pay

## 2018-06-14 ENCOUNTER — Ambulatory Visit
Admission: RE | Admit: 2018-06-14 | Discharge: 2018-06-14 | Disposition: A | Discharge status: Home / Self Care | Payer: Self-pay | Source: Ambulatory Visit | Attending: General Surgery | Admitting: General Surgery

## 2018-06-14 DIAGNOSIS — K611 Rectal abscess: Secondary | ICD-10-CM | POA: Insufficient documentation

## 2018-06-14 MED ORDER — GADOBUTROL 1 MMOL/ML IV SOLN: 8.0000 mL | Freq: Once | INTRAVENOUS | Status: DC | PRN

## 2018-06-14 MED ORDER — GADOBUTROL 1 MMOL/ML IV SOLN
7.5000 mL | Freq: Once | INTRAVENOUS | Status: AC | PRN
  Administered 2018-06-14: 7.5 mL via INTRAVENOUS

## 2018-06-17 ENCOUNTER — Telehealth: Payer: Self-pay | Admitting: General Surgery

## 2018-06-17 NOTE — Telephone Encounter (Signed)
I had an opportunity to review the patient's MRI of June 14, 2018 with the radiology service.  No deep extension, superficial disease limited to the left side.  The patient is amenable to repeat exploration and plans are in place for operative exploration exam under anesthesia and fistulotomy on Thursday, June 23, 2018.

## 2018-06-20 ENCOUNTER — Telehealth: Payer: Self-pay

## 2018-06-20 DIAGNOSIS — K610 Anal abscess: Secondary | ICD-10-CM

## 2018-06-20 NOTE — Telephone Encounter (Signed)
Spoke with the patient about his surgery scheduled for 06/23/18 with Dr Bary Castilla. He is aware that pre admit testing will be contacting him. He is also aware to call the OR desk the day prior to surgery to get his arrival time.

## 2018-06-21 ENCOUNTER — Other Ambulatory Visit: Payer: Self-pay

## 2018-06-21 ENCOUNTER — Encounter
Admission: RE | Admit: 2018-06-21 | Discharge: 2018-06-21 | Disposition: A | Discharge status: Home / Self Care | Payer: Self-pay | Source: Ambulatory Visit | Attending: General Surgery | Admitting: General Surgery

## 2018-06-21 ENCOUNTER — Other Ambulatory Visit: Payer: Self-pay | Admitting: General Surgery

## 2018-06-21 DIAGNOSIS — K611 Rectal abscess: Secondary | ICD-10-CM

## 2018-06-21 NOTE — Patient Instructions (Signed)
Your procedure is scheduled on: 06-23-18 Report to Same Day Surgery 2nd floor medical mall [facility] Entrance-take elevator on left to 2nd floor.  Check in with surgery information desk.) To find out your arrival time please call [phone] between 1PM - 3PM on 06-22-18  Remember: Instructions that are not followed completely may result in serious medical risk, up to and including death, or upon the discretion of your surgeon and anesthesiologist your surgery may need to be rescheduled.    _x___ 1. Do not eat food after midnight the night before your procedure. You may drink clear liquids up to 2 hours before you are scheduled to arrive at the hospital for your procedure.  Do not drink clear liquids within 2 hours of your scheduled arrival to the hospital.  Clear liquids include  --Water or Apple juice without pulp  --Clear carbohydrate beverage such as ClearFast or Gatorade  --Black Coffee or Clear Tea (No milk, no creamers, do not add anything to the coffee or Tea   ____Ensure clear carbohydrate drink on the way to the hospital for bariatric patients  ____Ensure clear carbohydrate drink 3 hours before surgery for Dr Dwyane Luo patients if physician instructed.   No gum chewing or hard candies.     __x__ 2. No Alcohol for 24 hours before or after surgery.   __x__3. No Smoking or e-cigarettes for 24 prior to surgery.  Do not use any chewable tobacco products for at least 6 hour prior to surgery   ____  4. Bring all medications with you on the day of surgery if instructed.    __x__ 5. Notify your doctor if there is any change in your medical condition     (cold, fever, infections).    x___6. On the morning of surgery brush your teeth with toothpaste and water.  You may rinse your mouth with mouth wash if you wish.  Do not swallow any toothpaste or mouthwash.   Do not wear jewelry, make-up, hairpins, clips or nail polish.  Do not wear lotions, powders, or perfumes. You may wear  deodorant.  Do not shave 48 hours prior to surgery. Men may shave face and neck.  Do not bring valuables to the hospital.    [facility] is not responsible for any belongings or valuables.               Contacts, dentures or bridgework may not be worn into surgery.  Leave your suitcase in the car. After surgery it may be brought to your room.  For patients admitted to the hospital, discharge time is determined by your treatment team.  _  Patients discharged the day of surgery will not be allowed to drive home.  You will need someone to drive you home and stay with you the night of your procedure.    Please read over the following fact sheets that you were given:   [facility] Preparing for Surgery   _x___ TAKE THE FOLLOWING MEDICATION THE MORNING OF SURGERY WITH A SMALL SIP OF WATER. These include:  1. LEVOTHYROXINE  2.  3.  4.  5.  6.  ____Fleets enema or Magnesium Citrate as directed.   ____ Use CHG Soap or sage wipes as directed on instruction sheet   ____ Use inhalers on the day of surgery and bring to hospital day of surgery  ____ Stop Metformin and Janumet 2 days prior to surgery.    ____ Take 1/2 of usual insulin dose the night before surgery  and none on the morning surgery.   ____ Follow recommendations from Cardiologist, Pulmonologist or PCP regarding stopping Aspirin, Coumadin, Plavix ,Eliquis, Effient, or Pradaxa, and Pletal.  ____Stop Anti-inflammatories such as Advil, Aleve, Ibuprofen, Motrin, Naproxen, Naprosyn, Goodies powders or aspirin products. OK to take Tylenol    ____ Stop supplements until after surgery.   ____ Bring C-Pap to the hospital.

## 2018-06-22 MED ORDER — SODIUM CHLORIDE 0.9 % IV SOLN
1.0000 g | INTRAVENOUS | Status: AC
  Administered 2018-06-23: 1 g via INTRAVENOUS
  Filled 2018-06-22: qty 1

## 2018-06-23 ENCOUNTER — Ambulatory Visit: Payer: Self-pay | Admitting: Anesthesiology

## 2018-06-23 ENCOUNTER — Encounter
Admission: RE | Disposition: A | Discharge status: Home / Self Care | Payer: Self-pay | Source: Home / Self Care | Attending: General Surgery

## 2018-06-23 ENCOUNTER — Other Ambulatory Visit: Payer: Self-pay

## 2018-06-23 ENCOUNTER — Ambulatory Visit
Admission: RE | Admit: 2018-06-23 | Discharge: 2018-06-23 | Disposition: A | Discharge status: Home / Self Care | Payer: Self-pay | Attending: General Surgery | Admitting: General Surgery

## 2018-06-23 ENCOUNTER — Encounter: Payer: Self-pay | Admitting: *Deleted

## 2018-06-23 DIAGNOSIS — F17210 Nicotine dependence, cigarettes, uncomplicated: Secondary | ICD-10-CM | POA: Insufficient documentation

## 2018-06-23 DIAGNOSIS — K611 Rectal abscess: Secondary | ICD-10-CM

## 2018-06-23 DIAGNOSIS — Z79899 Other long term (current) drug therapy: Secondary | ICD-10-CM | POA: Insufficient documentation

## 2018-06-23 DIAGNOSIS — E785 Hyperlipidemia, unspecified: Secondary | ICD-10-CM | POA: Insufficient documentation

## 2018-06-23 DIAGNOSIS — K610 Anal abscess: Secondary | ICD-10-CM | POA: Insufficient documentation

## 2018-06-23 DIAGNOSIS — E039 Hypothyroidism, unspecified: Secondary | ICD-10-CM | POA: Insufficient documentation

## 2018-06-23 HISTORY — PX: EVALUATION UNDER ANESTHESIA WITH ANAL FISTULECTOMY: SHX5621

## 2018-06-23 LAB — URINE DRUG SCREEN, QUALITATIVE (ARMC ONLY)
Amphetamines, Ur Screen: NOT DETECTED
Barbiturates, Ur Screen: NOT DETECTED
Benzodiazepine, Ur Scrn: NOT DETECTED
Cannabinoid 50 Ng, Ur ~~LOC~~: NOT DETECTED
Cocaine Metabolite,Ur ~~LOC~~: NOT DETECTED
MDMA (Ecstasy)Ur Screen: NOT DETECTED
METHADONE SCREEN, URINE: NOT DETECTED
OPIATE, UR SCREEN: NOT DETECTED
Phencyclidine (PCP) Ur S: NOT DETECTED
Tricyclic, Ur Screen: NOT DETECTED

## 2018-06-23 SURGERY — EXAM UNDER ANESTHESIA WITH ANAL FISTULECTOMY
Anesthesia: General | Site: Anus

## 2018-06-23 MED ORDER — LIDOCAINE HCL (CARDIAC) PF 100 MG/5ML IV SOSY
PREFILLED_SYRINGE | INTRAVENOUS | Status: DC | PRN
  Administered 2018-06-23: 40 mg via INTRAVENOUS

## 2018-06-23 MED ORDER — KETOROLAC TROMETHAMINE 30 MG/ML IJ SOLN
INTRAMUSCULAR | Status: DC | PRN
  Administered 2018-06-23: 30 mg via INTRAVENOUS

## 2018-06-23 MED ORDER — ACETAMINOPHEN 10 MG/ML IV SOLN
INTRAVENOUS | Status: AC
  Filled 2018-06-23: qty 100

## 2018-06-23 MED ORDER — HYDROCODONE-ACETAMINOPHEN 5-325 MG PO TABS: 1.0000 | ORAL_TABLET | ORAL | 0 refills | Status: DC | PRN

## 2018-06-23 MED ORDER — GABAPENTIN 300 MG PO CAPS
ORAL_CAPSULE | ORAL | Status: AC
  Administered 2018-06-23: 300 mg via ORAL
  Filled 2018-06-23: qty 1

## 2018-06-23 MED ORDER — BUPIVACAINE-EPINEPHRINE (PF) 0.5% -1:200000 IJ SOLN
INTRAMUSCULAR | Status: DC | PRN
  Administered 2018-06-23: 30 mL

## 2018-06-23 MED ORDER — FAMOTIDINE 20 MG PO TABS
ORAL_TABLET | ORAL | Status: AC
  Administered 2018-06-23: 20 mg via ORAL
  Filled 2018-06-23: qty 1

## 2018-06-23 MED ORDER — HYDROCODONE-ACETAMINOPHEN 5-325 MG PO TABS
ORAL_TABLET | ORAL | Status: AC
  Filled 2018-06-23: qty 1

## 2018-06-23 MED ORDER — KETOROLAC TROMETHAMINE 30 MG/ML IJ SOLN
INTRAMUSCULAR | Status: AC
  Filled 2018-06-23: qty 1

## 2018-06-23 MED ORDER — BUPIVACAINE HCL (PF) 0.5 % IJ SOLN
INTRAMUSCULAR | Status: AC
  Filled 2018-06-23: qty 30

## 2018-06-23 MED ORDER — LIDOCAINE HCL (PF) 2 % IJ SOLN
INTRAMUSCULAR | Status: AC
  Filled 2018-06-23: qty 10

## 2018-06-23 MED ORDER — PROMETHAZINE HCL 25 MG/ML IJ SOLN: 6.2500 mg | INTRAMUSCULAR | Status: DC | PRN

## 2018-06-23 MED ORDER — ACETAMINOPHEN 10 MG/ML IV SOLN
INTRAVENOUS | Status: DC | PRN
  Administered 2018-06-23: 1000 mg via INTRAVENOUS

## 2018-06-23 MED ORDER — PROPOFOL 10 MG/ML IV BOLUS
INTRAVENOUS | Status: DC | PRN
  Administered 2018-06-23: 160 mg via INTRAVENOUS

## 2018-06-23 MED ORDER — FENTANYL CITRATE (PF) 100 MCG/2ML IJ SOLN
INTRAMUSCULAR | Status: AC
  Filled 2018-06-23: qty 2

## 2018-06-23 MED ORDER — HYDROCODONE-ACETAMINOPHEN 5-325 MG PO TABS
1.0000 | ORAL_TABLET | ORAL | Status: DC | PRN
  Administered 2018-06-23: 1 via ORAL

## 2018-06-23 MED ORDER — MEPERIDINE HCL 50 MG/ML IJ SOLN: 6.2500 mg | INTRAMUSCULAR | Status: DC | PRN

## 2018-06-23 MED ORDER — MIDAZOLAM HCL 2 MG/2ML IJ SOLN
INTRAMUSCULAR | Status: AC
  Filled 2018-06-23: qty 2

## 2018-06-23 MED ORDER — MIDAZOLAM HCL 2 MG/2ML IJ SOLN
INTRAMUSCULAR | Status: DC | PRN
  Administered 2018-06-23: 2 mg via INTRAVENOUS

## 2018-06-23 MED ORDER — PROPOFOL 10 MG/ML IV BOLUS
INTRAVENOUS | Status: AC
  Filled 2018-06-23: qty 20

## 2018-06-23 MED ORDER — FENTANYL CITRATE (PF) 100 MCG/2ML IJ SOLN
25.0000 ug | INTRAMUSCULAR | Status: DC | PRN
  Administered 2018-06-23 (×4): 25 ug via INTRAVENOUS
  Administered 2018-06-23: 50 ug via INTRAVENOUS

## 2018-06-23 MED ORDER — ONDANSETRON HCL 4 MG/2ML IJ SOLN
INTRAMUSCULAR | Status: DC | PRN
  Administered 2018-06-23: 4 mg via INTRAVENOUS

## 2018-06-23 MED ORDER — OXYCODONE HCL 5 MG/5ML PO SOLN: 5.0000 mg | Freq: Once | ORAL | Status: DC | PRN

## 2018-06-23 MED ORDER — PHENYLEPHRINE HCL 10 MG/ML IJ SOLN
INTRAMUSCULAR | Status: DC | PRN
  Administered 2018-06-23 (×2): 100 ug via INTRAVENOUS

## 2018-06-23 MED ORDER — ONDANSETRON HCL 4 MG/2ML IJ SOLN
INTRAMUSCULAR | Status: AC
  Filled 2018-06-23: qty 2

## 2018-06-23 MED ORDER — FAMOTIDINE 20 MG PO TABS
20.0000 mg | ORAL_TABLET | Freq: Once | ORAL | Status: AC
  Administered 2018-06-23: 20 mg via ORAL

## 2018-06-23 MED ORDER — GABAPENTIN 300 MG PO CAPS
300.0000 mg | ORAL_CAPSULE | ORAL | Status: AC
  Administered 2018-06-23: 300 mg via ORAL

## 2018-06-23 MED ORDER — EPINEPHRINE PF 1 MG/ML IJ SOLN
INTRAMUSCULAR | Status: AC
  Filled 2018-06-23: qty 1

## 2018-06-23 MED ORDER — OXYCODONE HCL 5 MG PO TABS: 5.0000 mg | ORAL_TABLET | Freq: Once | ORAL | Status: DC | PRN

## 2018-06-23 MED ORDER — FENTANYL CITRATE (PF) 100 MCG/2ML IJ SOLN
INTRAMUSCULAR | Status: DC | PRN
  Administered 2018-06-23 (×2): 50 ug via INTRAVENOUS

## 2018-06-23 MED ORDER — LACTATED RINGERS IV SOLN
INTRAVENOUS | Status: DC
  Administered 2018-06-23 (×2): via INTRAVENOUS

## 2018-06-23 SURGICAL SUPPLY — 30 items
BLADE SURG 15 STRL SS SAFETY (BLADE) ×3 IMPLANT
BRIEF STRETCH MATERNITY 2XLG (MISCELLANEOUS) ×3 IMPLANT
CANISTER SUCT 1200ML W/VALVE (MISCELLANEOUS) ×6 IMPLANT
COVER WAND RF STERILE (DRAPES) IMPLANT
DRAPE LAPAROTOMY 100X77 ABD (DRAPES) ×3 IMPLANT
DRAPE LEGGINS SURG 28X43 STRL (DRAPES) ×3 IMPLANT
DRAPE UNDER BUTTOCK W/FLU (DRAPES) ×3 IMPLANT
DRSG GAUZE PETRO 6X36 STRIP ST (GAUZE/BANDAGES/DRESSINGS) IMPLANT
ELECT REM PT RETURN 9FT ADLT (ELECTROSURGICAL) ×3
ELECTRODE REM PT RTRN 9FT ADLT (ELECTROSURGICAL) ×1 IMPLANT
GLOVE BIO SURGEON STRL SZ7.5 (GLOVE) ×3 IMPLANT
GLOVE INDICATOR 8.0 STRL GRN (GLOVE) ×6 IMPLANT
GOWN STRL REUS W/ TWL LRG LVL3 (GOWN DISPOSABLE) ×2 IMPLANT
GOWN STRL REUS W/TWL LRG LVL3 (GOWN DISPOSABLE) ×4
LABEL OR SOLS (LABEL) ×3 IMPLANT
NEEDLE HYPO 22GX1.5 SAFETY (NEEDLE) ×3 IMPLANT
NEEDLE HYPO 25X1 1.5 SAFETY (NEEDLE) ×3 IMPLANT
PACK BASIN MINOR ARMC (MISCELLANEOUS) ×3 IMPLANT
PAD OB MATERNITY 4.3X12.25 (PERSONAL CARE ITEMS) ×3 IMPLANT
PAD PREP 24X41 OB/GYN DISP (PERSONAL CARE ITEMS) ×3 IMPLANT
SHEARS HARMONIC 9CM CVD (BLADE) IMPLANT
STRAP SAFETY 5IN WIDE (MISCELLANEOUS) IMPLANT
SUCT SIGMOIDOSCOPE TIP 18 W/TU (SUCTIONS) ×3 IMPLANT
SURGILUBE 2OZ TUBE FLIPTOP (MISCELLANEOUS) ×3 IMPLANT
SUT MNCRL 3-0 UNDYED SH (SUTURE) ×1 IMPLANT
SUT MONOCRYL 3-0 UNDYED (SUTURE) ×2
SUT SILK 0 CT 1 30 (SUTURE) IMPLANT
SUT VIC AB 3-0 SH 27 (SUTURE) ×2
SUT VIC AB 3-0 SH 27X BRD (SUTURE) ×1 IMPLANT
SYR 10ML LL (SYRINGE) ×3 IMPLANT

## 2018-06-23 NOTE — Anesthesia Procedure Notes (Signed)
Procedure Name: LMA Insertion Date/Time: 06/23/2018 7:50 AM Performed by: Allean Found, CRNA Pre-anesthesia Checklist: Patient identified, Patient being monitored, Timeout performed, Emergency Drugs available and Suction available Patient Re-evaluated:Patient Re-evaluated prior to induction Oxygen Delivery Method: Circle system utilized Preoxygenation: Pre-oxygenation with 100% oxygen Induction Type: IV induction Ventilation: Mask ventilation without difficulty LMA: LMA inserted LMA Size: 4.0 Tube type: Oral Number of attempts: 1 Placement Confirmation: positive ETCO2 and breath sounds checked- equal and bilateral Tube secured with: Tape Dental Injury: Teeth and Oropharynx as per pre-operative assessment

## 2018-06-23 NOTE — Discharge Instructions (Signed)

## 2018-06-23 NOTE — OR Nursing (Signed)
Ok to d/c to home without MD visit per Dr. Bary Castilla verbal via tele to Lorie Apley, RN

## 2018-06-23 NOTE — Op Note (Signed)
Preoperative diagnosis: Recurrent perianal abscess, suspected anal fistula.  Postoperative diagnosis: Same.  Operative procedure: Exam under anesthesia: Debridement of perineum.  Anoscopy.  Operating Surgeon: Charlie Pitter, MD.  Anesthesia: General by LMA, Marcaine 0.5% with 1-200,000 notes of epinephrine, 30 cc.  Estimated blood loss: Less than 5 cc.  This 58 year old male has had previous perianal infections.  He is developed recurrent inflammation in the left anterior aspect.  MRI showed all superficial disease.  He is return to the operating room for repeat debridement and exam under anesthesia.  He received Invanz prior to the procedure.  Operative note: The patient underwent general anesthesia and tolerated this well.  He was placed in dorsolithotomy position and the perineum cleansed with Betadine solution and draped.  Marcaine was infiltrated for postoperative analgesia and hemostasis.  The inflammatory process was that the 2-3 o'clock position.  A lacrimal duct probe was used to cannulate the area and a 7-8 cm incision from about midway between the base of the scrotum and the anus to the area behind the anus was opened.  Granulation tissue was debrided with a curette.  Gentle probing showed no clear fistula tract.  Anoscopy showed no internal fistula opening and digital rectal exam was unremarkable.  Once the area had been cleaned including division of the superficial fibers of the anal sphincter of all chronic granulation tissue the area was irrigated with saline solution.  3-0 Monocryl sutures were used to approximate the adipose layer and loosely approximate the skin.  A dry dressing was applied and the patient taken to the recovery room in stable condition.

## 2018-06-23 NOTE — Anesthesia Postprocedure Evaluation (Signed)
Anesthesia Post Note  Patient: William Ochoa  Procedure(s) Performed: EXAM UNDER ANESTHESIA WITH ANAL FISTULECTOMY (N/A Anus)  Patient location during evaluation: PACU Anesthesia Type: General Level of consciousness: awake and alert and oriented Pain management: pain level controlled Vital Signs Assessment: post-procedure vital signs reviewed and stable Respiratory status: spontaneous breathing, nonlabored ventilation and respiratory function stable Cardiovascular status: blood pressure returned to baseline and stable Postop Assessment: no signs of nausea or vomiting Anesthetic complications: no     Last Vitals:  Vitals:   06/23/18 0933 06/23/18 0952  BP: 117/74   Pulse: 64   Resp: 16   Temp:  36.6 C  SpO2: 97%     Last Pain:  Vitals:   06/23/18 0952  TempSrc: Temporal  PainSc: 5                  Dali Kraner

## 2018-06-23 NOTE — H&P (Signed)
No change in clinical history or exam. For EUA and fistulotomy, abscess drainage.

## 2018-06-23 NOTE — Anesthesia Preprocedure Evaluation (Signed)
Anesthesia Evaluation  Patient identified by MRN, date of birth, ID band Patient awake    Reviewed: Allergy & Precautions, NPO status , Patient's Chart, lab work & pertinent test results  History of Anesthesia Complications Negative for: history of anesthetic complications  Airway Mallampati: II  TM Distance: >3 FB Neck ROM: Full    Dental  (+) Edentulous Lower, Edentulous Upper   Pulmonary neg sleep apnea, neg COPD, Current Smoker,    breath sounds clear to auscultation- rhonchi (-) wheezing      Cardiovascular Exercise Tolerance: Good (-) hypertension(-) CAD, (-) Past MI, (-) Cardiac Stents and (-) CABG  Rhythm:Regular Rate:Normal - Systolic murmurs and - Diastolic murmurs    Neuro/Psych negative neurological ROS  negative psych ROS   GI/Hepatic GERD  ,(+) Hepatitis -  Endo/Other  neg diabetesHypothyroidism   Renal/GU negative Renal ROS     Musculoskeletal negative musculoskeletal ROS (+)   Abdominal (+) - obese,   Peds  Hematology negative hematology ROS (+)   Anesthesia Other Findings Past Medical History: No date: GERD (gastroesophageal reflux disease)     Comment:  rare-no meds 2015: Glaucoma 1970'S: Hepatitis     Comment:  A OR B-PT CANNOT REMEMBER WHICH ONE No date: Hyperlipidemia No date: Hypothyroidism No date: Thyroid disease   Reproductive/Obstetrics                             Anesthesia Physical  Anesthesia Plan  ASA: II  Anesthesia Plan: General   Post-op Pain Management:    Induction: Intravenous  PONV Risk Score and Plan: 0 and Ondansetron and Midazolam  Airway Management Planned: LMA  Additional Equipment:   Intra-op Plan:   Post-operative Plan:   Informed Consent: I have reviewed the patients History and Physical, chart, labs and discussed the procedure including the risks, benefits and alternatives for the proposed anesthesia with the patient or  authorized representative who has indicated his/her understanding and acceptance.     Dental advisory given  Plan Discussed with: CRNA and Anesthesiologist  Anesthesia Plan Comments:         Anesthesia Quick Evaluation

## 2018-06-23 NOTE — Transfer of Care (Signed)
Immediate Anesthesia Transfer of Care Note  Patient: William Ochoa  Procedure(s) Performed: EXAM UNDER ANESTHESIA WITH ANAL FISTULECTOMY (N/A Anus)  Patient Location: PACU  Anesthesia Type:General  Level of Consciousness: awake  Airway & Oxygen Therapy: Patient Spontanous Breathing and Patient connected to face mask oxygen  Post-op Assessment: Report given to RN and Post -op Vital signs reviewed and stable  Post vital signs: Reviewed and stable  Last Vitals:  Vitals Value Taken Time  BP 111/77 06/23/2018  8:30 AM  Temp    Pulse 72 06/23/2018  8:33 AM  Resp 15 06/23/2018  8:33 AM  SpO2 96 % 06/23/2018  8:33 AM  Vitals shown include unvalidated device data.  Last Pain:  Vitals:   06/23/18 0629  TempSrc: Tympanic  PainSc: 0-No pain         Complications: No apparent anesthesia complications

## 2018-06-23 NOTE — Anesthesia Post-op Follow-up Note (Signed)
Anesthesia QCDR form completed.        

## 2018-06-24 LAB — SURGICAL PATHOLOGY

## 2018-06-28 ENCOUNTER — Other Ambulatory Visit: Payer: Self-pay | Admitting: General Surgery

## 2018-06-28 MED ORDER — HYDROCODONE-ACETAMINOPHEN 10-325 MG PO TABS: 1.0000 | ORAL_TABLET | Freq: Four times a day (QID) | ORAL | 0 refills | Status: AC | PRN

## 2018-06-28 NOTE — Progress Notes (Signed)
Patient post anal debridement called requesting additional narcotics. Extensive debridement undertaken. Will refill Norco 5/ 325, #30, 1 po q6h prn.

## 2018-06-30 ENCOUNTER — Encounter: Payer: Self-pay | Admitting: General Surgery

## 2018-06-30 ENCOUNTER — Ambulatory Visit (INDEPENDENT_AMBULATORY_CARE_PROVIDER_SITE_OTHER): Payer: Self-pay | Admitting: General Surgery

## 2018-06-30 ENCOUNTER — Other Ambulatory Visit: Payer: Self-pay

## 2018-06-30 VITALS — BP 113/71 | HR 82 | Temp 97.7°F | Resp 14 | Ht 68.0 in | Wt 182.0 lb

## 2018-06-30 DIAGNOSIS — K611 Rectal abscess: Secondary | ICD-10-CM

## 2018-06-30 NOTE — Patient Instructions (Addendum)
The patient is aware to call back for any questions or new concerns. May use triple antibiotic ointment to the area The patient is aware to use a heating pad as needed for comfort.

## 2018-06-30 NOTE — Progress Notes (Signed)
Patient ID: William Ochoa, male   DOB: [birth year], 58 y.o.   MRN: [id number]  Chief Complaint  Patient presents with  . Routine Post Op    1 week post op rectal exam/abscess sx 06/23/18    HPI William Ochoa is a 58 y.o. male.  Here for postoperative visit, rectal abscess surgery 06-23-18. He denies bleeding, but occasional drainage. He is still using the hydrocodone for the pain.  HPI  Past Medical History:  Diagnosis Date  . GERD (gastroesophageal reflux disease)    rare-no meds  . Glaucoma 2015  . Hepatitis 1970'S   A OR B-PT CANNOT REMEMBER WHICH ONE  . Hyperlipidemia   . Hypothyroidism   . Thyroid disease     Past Surgical History:  Procedure Laterality Date  . ANAL FISTULOTOMY N/A 01/03/2018   Procedure: ANAL FISTULOTOMY;  Surgeon: Robert Bellow, MD;  Location: [facility];  Service: General;  Laterality: N/A;  . ANAL FISTULOTOMY N/A 02/23/2018   Procedure: ANAL FISTULOTOMY;  Surgeon: Robert Bellow, MD;  Location: [facility];  Service: General;  Laterality: N/A;  . ARTHOSCOPIC ROTAOR CUFF REPAIR Right 06/21/2017   Procedure: ARTHROSCOPIC ROTATOR CUFF REPAIR;  Surgeon: Lovell Sheehan, MD;  Location: [facility];  Service: Orthopedics;  Laterality: Right;  . EVALUATION UNDER ANESTHESIA WITH ANAL FISTULECTOMY N/A 06/23/2018   Procedure: EXAM UNDER ANESTHESIA WITH ANAL FISTULECTOMY;  Surgeon: Robert Bellow, MD;  Location: [facility];  Service: General;  Laterality: N/A;  . EYE SURGERY Left 2016   CORNEAL SURGERY  . FISTULOTOMY N/A 10/01/2015   Procedure: FISTULOTOMY;  Surgeon: Robert Bellow, MD;  Location: [facility];  Service: General;  Laterality: N/A;  . [place]  . INCISION AND DRAINAGE PERIRECTAL ABSCESS N/A 10/01/2015   Procedure: IRRIGATION AND DEBRIDEMENT PERIRECTAL ABSCESS;  Surgeon: Robert Bellow, MD;  Location: [facility];  Service: General;  Laterality: N/A;  . RESECTION DISTAL CLAVICAL  06/21/2017   Procedure: DISTAL CLAVICALEXCISION;   Surgeon: Lovell Sheehan, MD;  Location: [facility];  Service: Orthopedics;;  . SHOULDER ARTHROSCOPY WITH BICEPSTENOTOMY Right 06/21/2017   Procedure: SHOULDER ARTHROSCOPY WITH BICEPSTENOTOMY;  Surgeon: Lovell Sheehan, MD;  Location: [facility];  Service: Orthopedics;  Laterality: Right;  . SHOULDER ARTHROSCOPY WITH OPEN ROTATOR CUFF REPAIR Right 06/21/2017   Procedure: SHOULDER ARTHROSCOPY;  Surgeon: Lovell Sheehan, MD;  Location: [facility];  Service: Orthopedics;  Laterality: Right;  . SIGMOIDOSCOPY N/A 10/01/2015   Procedure: SIGMOIDOSCOPY;  Surgeon: Robert Bellow, MD;  Location: [facility];  Service: General;  Laterality: N/A;  . SUBACROMIAL DECOMPRESSION Right 06/21/2017   Procedure: SUBACROMIAL DECOMPRESSION;  Surgeon: Lovell Sheehan, MD;  Location: [facility];  Service: Orthopedics;  Laterality: Right;    Family History  Problem Relation Age of Onset  . Cancer Mother        skin  . Cancer Father        Prostate    Social History Social History   Tobacco Use  . Smoking status: Current Every Day Smoker    Packs/day: 1.00    Years: 30.00    Pack years: 30.00    Types: Cigarettes  . Smokeless tobacco: Never Used  Substance Use Topics  . Alcohol use: No    Alcohol/week: 0.0 standard drinks    Comment: SOBER FOR 25 YEARS  . Drug use: No    Comment: H/O COCAINE 25 YEARS AGO    No Known Allergies  Current Outpatient Medications  Medication Sig Dispense Refill  . Carboxymeth-Glycerin-Polysorb (REFRESH OPTIVE ADVANCED) 0.5-1-0.5 % SOLN Place 1 drop into both eyes 2 (two) times daily as needed (for dry eyes).     Marland Kitchen HYDROcodone-acetaminophen (NORCO) 10-325 MG tablet Take 1 tablet by mouth every 6 (six) hours as needed. 30 tablet 0  . levothyroxine (SYNTHROID, LEVOTHROID) 125 MCG tablet Take 125 mcg by mouth daily before breakfast.   0  . Multiple Vitamins-Minerals (CENTRUM ADULTS PO) Take 1 tablet by mouth daily.     . simvastatin (ZOCOR) 20 MG tablet Take 20 mg by mouth at bedtime.    0  . ibuprofen (ADVIL,MOTRIN) 200 MG tablet Take 200 mg by mouth every 6 (six) hours as needed for headache or moderate pain.    Marland Kitchen nystatin-triamcinolone ointment (MYCOLOG) Apply 1 application topically 3 (three) times daily. (Patient not taking: Reported on 06/30/2018) 30 g 1   No current facility-administered medications for this visit.     Review of Systems Review of Systems  Constitutional: Negative.   Respiratory: Negative.   Cardiovascular: Negative.     Blood pressure 113/71, pulse 82, temperature 97.7 F (36.5 C), temperature source Temporal, resp. rate 14, height 5\' 8"  (1.727 m), weight 182 lb (82.6 kg), SpO2 96 %.  Physical Exam Physical Exam Constitutional:      Appearance: Normal appearance.  Genitourinary:   Skin:    General: Skin is warm and dry.  Neurological:     Mental Status: He is alert and oriented to person, place, and time.  Psychiatric:        Mood and Affect: Mood normal.     Data Reviewed March 19,2020 resection tissue:  A. SKIN AND SOFT TISSUE, LEFT PERINEUM; DEBRIDEMENT:  - SQUAMOUS EPITHELIAL-LINED SINUS TRACT WITH SCARRING AND CHRONIC  INFLAMMATION.  - NO GRANULOMAS.  - NEGATIVE FOR MALIGNANCY.   Assessment Recurrent peri-anal abscess without evidence of fistula.   Plan  May use triple antibiotic ointment to the area. The patient is aware to use a heating pad as needed for comfort.  Follow up 3 weeks   HPI, assessment, plan and physical exam has been scribed under the direction and in the presence of Robert Bellow, MD. Karie Fetch, RN  I have completed the exam and reviewed the above documentation for accuracy and completeness.  I agree with the above.  Haematologist has been used and any errors in dictation or transcription are unintentional.  Hervey Ard, M.D., F.A.C.S.    William Ochoa 06/30/2018, 9:46 AM

## 2018-07-26 ENCOUNTER — Ambulatory Visit (INDEPENDENT_AMBULATORY_CARE_PROVIDER_SITE_OTHER): Payer: Self-pay | Admitting: General Surgery

## 2018-07-26 ENCOUNTER — Encounter: Payer: Self-pay | Admitting: General Surgery

## 2018-07-26 ENCOUNTER — Other Ambulatory Visit: Payer: Self-pay

## 2018-07-26 VITALS — BP 115/69 | HR 74 | Temp 91.9°F | Resp 14 | Ht 68.0 in | Wt 184.0 lb

## 2018-07-26 DIAGNOSIS — K603 Anal fistula: Secondary | ICD-10-CM

## 2018-07-26 NOTE — Patient Instructions (Signed)
Patient will need to return to the office as needed.    Call the office with any questions or concerns. 

## 2018-07-26 NOTE — Progress Notes (Signed)
Patient ID: William Ochoa, male   DOB: 1960-05-03, 58 y.o.   MRN: [id number]  Chief Complaint  Patient presents with  . Follow-up    3 week f/u s/p eua with anal fistulectomy done 06-23-18    HPI William Ochoa is a 58 y.o. male here today for a follow up for 3 week s/p eua with anal fistulectomy done 06-23-18. Patient states he is doing well and does not have any pain at the moment.  No longer utilizing narcotics.  He is applying Silvadene frequently during the day to keep the area soft.  He found that Vaseline was too "greasy". HPI  Past Medical History:  Diagnosis Date  . GERD (gastroesophageal reflux disease)    rare-no meds  . Glaucoma 2015  . Hepatitis 1970'S   A OR B-PT CANNOT REMEMBER WHICH ONE  . Hyperlipidemia   . Hypothyroidism   . Thyroid disease     Past Surgical History:  Procedure Laterality Date  . ANAL FISTULOTOMY N/A 01/03/2018   Procedure: ANAL FISTULOTOMY;  Surgeon: Robert Bellow, MD;  Location: [facility];  Service: General;  Laterality: N/A;  . ANAL FISTULOTOMY N/A 02/23/2018   Procedure: ANAL FISTULOTOMY;  Surgeon: Robert Bellow, MD;  Location: [facility];  Service: General;  Laterality: N/A;  . ARTHOSCOPIC ROTAOR CUFF REPAIR Right 06/21/2017   Procedure: ARTHROSCOPIC ROTATOR CUFF REPAIR;  Surgeon: Lovell Sheehan, MD;  Location: [facility];  Service: Orthopedics;  Laterality: Right;  . EVALUATION UNDER ANESTHESIA WITH ANAL FISTULECTOMY N/A 06/23/2018   Procedure: EXAM UNDER ANESTHESIA WITH ANAL FISTULECTOMY;  Surgeon: Robert Bellow, MD;  Location: [facility];  Service: General;  Laterality: N/A;  . EYE SURGERY Left 2016   CORNEAL SURGERY  . FISTULOTOMY N/A 10/01/2015   Procedure: FISTULOTOMY;  Surgeon: Robert Bellow, MD;  Location: [facility];  Service: General;  Laterality: N/A;  . [place]  . INCISION AND DRAINAGE PERIRECTAL ABSCESS N/A 10/01/2015   Procedure: IRRIGATION AND DEBRIDEMENT PERIRECTAL ABSCESS;  Surgeon: Robert Bellow, MD;  Location: [facility];  Service: General;  Laterality: N/A;  . RESECTION DISTAL CLAVICAL  06/21/2017   Procedure: DISTAL CLAVICALEXCISION;  Surgeon: Lovell Sheehan, MD;  Location: [facility];  Service: Orthopedics;;  . SHOULDER ARTHROSCOPY WITH BICEPSTENOTOMY Right 06/21/2017   Procedure: SHOULDER ARTHROSCOPY WITH BICEPSTENOTOMY;  Surgeon: Lovell Sheehan, MD;  Location: [facility];  Service: Orthopedics;  Laterality: Right;  . SHOULDER ARTHROSCOPY WITH OPEN ROTATOR CUFF REPAIR Right 06/21/2017   Procedure: SHOULDER ARTHROSCOPY;  Surgeon: Lovell Sheehan, MD;  Location: [facility];  Service: Orthopedics;  Laterality: Right;  . SIGMOIDOSCOPY N/A 10/01/2015   Procedure: SIGMOIDOSCOPY;  Surgeon: Robert Bellow, MD;  Location: [facility];  Service: General;  Laterality: N/A;  . SUBACROMIAL DECOMPRESSION Right 06/21/2017   Procedure: SUBACROMIAL DECOMPRESSION;  Surgeon: Lovell Sheehan, MD;  Location: [facility];  Service: Orthopedics;  Laterality: Right;    Family History  Problem Relation Age of Onset  . Cancer Mother        skin  . Cancer Father        Prostate    Social History Social History   Tobacco Use  . Smoking status: Current Every Day Smoker    Packs/day: 1.00    Years: 30.00    Pack years: 30.00    Types: Cigarettes  . Smokeless tobacco: Never Used  Substance Use Topics  . Alcohol use: No    Alcohol/week: 0.0 standard drinks  Comment: SOBER FOR 25 YEARS  . Drug use: No    Comment: H/O COCAINE 25 YEARS AGO    No Known Allergies  Current Outpatient Medications  Medication Sig Dispense Refill  . Carboxymeth-Glycerin-Polysorb (REFRESH OPTIVE ADVANCED) 0.5-1-0.5 % SOLN Place 1 drop into both eyes 2 (two) times daily as needed (for dry eyes).     Marland Kitchen HYDROcodone-acetaminophen (NORCO) 10-325 MG tablet Take 1 tablet by mouth every 6 (six) hours as needed. 30 tablet 0  . ibuprofen (ADVIL,MOTRIN) 200 MG tablet Take 200 mg by mouth every 6 (six) hours as needed for headache  or moderate pain.    Marland Kitchen levothyroxine (SYNTHROID, LEVOTHROID) 125 MCG tablet Take 125 mcg by mouth daily before breakfast.   0  . Multiple Vitamins-Minerals (CENTRUM ADULTS PO) Take 1 tablet by mouth daily.     Marland Kitchen nystatin-triamcinolone ointment (MYCOLOG) Apply 1 application topically 3 (three) times daily. 30 g 1  . simvastatin (ZOCOR) 20 MG tablet Take 20 mg by mouth at bedtime.   0   No current facility-administered medications for this visit.     Review of Systems Review of Systems  Constitutional: Negative.   Respiratory: Negative.   Cardiovascular: Negative.     Blood pressure 115/69, pulse 74, temperature (!) 91.9 F (33.3 C), temperature source Temporal, resp. rate 14, height 5\' 8"  (1.727 m), weight 184 lb (83.5 kg), SpO2 91 %.  Physical Exam Physical Exam Constitutional:      Appearance: He is well-developed.  Eyes:     General: No scleral icterus.    Conjunctiva/sclera: Conjunctivae normal.  Neck:     Musculoskeletal: Normal range of motion.  Skin:    General: Skin is warm and dry.  Neurological:     Mental Status: He is alert and oriented to person, place, and time.   Media Information   Document Information   Photos  Post op from March 2020. Complete epithelial coverage.   07/26/2018 10:32  Attached To:  Office Visit on 07/26/18 with Bary Castilla Forest Gleason, MD  Source Information   Agnes Brightbill, Forest Gleason, MD  [facility]     Data Reviewed Pathology previously reviewed, benign.  Assessment Complete healing of superficial anal fistula/abscess.  Plan The patient will make use of skin lotion rather than Silvadene cream to keep the area soft.  Follow-up will be on an as-needed basis.   HPI, Physical Exam, Assessment and Plan have been scribed under the direction and in the presence of Hervey Ard, Md.  Eudelia Bunch R. Bobette Mo, CMA  I have completed the exam and reviewed the above documentation for accuracy and completeness.  I agree with the  above.  Haematologist has been used and any errors in dictation or transcription are unintentional.  Hervey Ard, M.D., F.A.C.S.  Forest Gleason Ioanna Colquhoun 07/26/2018, 10:51 AM

## 2020-05-21 ENCOUNTER — Other Ambulatory Visit: Payer: Self-pay | Admitting: Podiatry

## 2020-06-03 ENCOUNTER — Other Ambulatory Visit: Payer: Self-pay

## 2020-06-03 ENCOUNTER — Encounter: Payer: Self-pay | Admitting: Podiatry

## 2020-06-04 ENCOUNTER — Other Ambulatory Visit
Admission: RE | Admit: 2020-06-04 | Discharge: 2020-06-04 | Disposition: A | Discharge status: Home / Self Care | Payer: 59 | Source: Ambulatory Visit | Attending: Podiatry | Admitting: Podiatry

## 2020-06-04 DIAGNOSIS — Z01812 Encounter for preprocedural laboratory examination: Secondary | ICD-10-CM | POA: Insufficient documentation

## 2020-06-04 DIAGNOSIS — Z20822 Contact with and (suspected) exposure to covid-19: Secondary | ICD-10-CM | POA: Diagnosis not present

## 2020-06-05 LAB — SARS CORONAVIRUS 2 (TAT 6-24 HRS): SARS Coronavirus 2: NEGATIVE

## 2020-06-06 ENCOUNTER — Ambulatory Visit: Payer: 59 | Admitting: Anesthesiology

## 2020-06-06 ENCOUNTER — Encounter: Payer: Self-pay | Admitting: Podiatry

## 2020-06-06 ENCOUNTER — Encounter
Admission: RE | Disposition: A | Discharge status: Home / Self Care | Payer: Self-pay | Source: Home / Self Care | Attending: Podiatry

## 2020-06-06 ENCOUNTER — Ambulatory Visit
Admission: RE | Admit: 2020-06-06 | Discharge: 2020-06-06 | Disposition: A | Discharge status: Home / Self Care | Payer: 59 | Attending: Podiatry | Admitting: Podiatry

## 2020-06-06 ENCOUNTER — Other Ambulatory Visit: Payer: Self-pay

## 2020-06-06 DIAGNOSIS — Z79899 Other long term (current) drug therapy: Secondary | ICD-10-CM | POA: Diagnosis not present

## 2020-06-06 DIAGNOSIS — Z09 Encounter for follow-up examination after completed treatment for conditions other than malignant neoplasm: Secondary | ICD-10-CM

## 2020-06-06 DIAGNOSIS — R2242 Localized swelling, mass and lump, left lower limb: Secondary | ICD-10-CM | POA: Diagnosis present

## 2020-06-06 DIAGNOSIS — D2372 Other benign neoplasm of skin of left lower limb, including hip: Secondary | ICD-10-CM | POA: Diagnosis not present

## 2020-06-06 HISTORY — DX: Presence of dental prosthetic device (complete) (partial): Z97.2

## 2020-06-06 HISTORY — PX: MASS EXCISION: SHX2000

## 2020-06-06 SURGERY — EXCISION MASS
Anesthesia: General | Site: Foot | Laterality: Left

## 2020-06-06 MED ORDER — ONDANSETRON HCL 4 MG/2ML IJ SOLN
INTRAMUSCULAR | Status: DC | PRN
  Administered 2020-06-06: 4 mg via INTRAVENOUS

## 2020-06-06 MED ORDER — LIDOCAINE HCL (CARDIAC) PF 100 MG/5ML IV SOSY
PREFILLED_SYRINGE | INTRAVENOUS | Status: DC | PRN
  Administered 2020-06-06: 60 mg via INTRAVENOUS

## 2020-06-06 MED ORDER — OXYCODONE HCL 5 MG/5ML PO SOLN: 5.0000 mg | Freq: Once | ORAL | Status: DC | PRN

## 2020-06-06 MED ORDER — BUPIVACAINE HCL (PF) 0.5 % IJ SOLN
INTRAMUSCULAR | Status: DC | PRN
  Administered 2020-06-06 (×2): 10 mL

## 2020-06-06 MED ORDER — LACTATED RINGERS IV SOLN: INTRAVENOUS | Status: DC

## 2020-06-06 MED ORDER — MIDAZOLAM HCL 2 MG/2ML IJ SOLN
INTRAMUSCULAR | Status: DC | PRN
  Administered 2020-06-06: 2 mg via INTRAVENOUS

## 2020-06-06 MED ORDER — GLYCOPYRROLATE 0.2 MG/ML IJ SOLN
INTRAMUSCULAR | Status: DC | PRN
  Administered 2020-06-06 (×2): .1 mg via INTRAVENOUS

## 2020-06-06 MED ORDER — HYDROCODONE-ACETAMINOPHEN 5-325 MG PO TABS
1.0000 | ORAL_TABLET | Freq: Four times a day (QID) | ORAL | 0 refills | Status: AC | PRN
Start: 2020-06-06 — End: 2020-06-13

## 2020-06-06 MED ORDER — OXYCODONE HCL 5 MG PO TABS: 5.0000 mg | ORAL_TABLET | Freq: Once | ORAL | Status: DC | PRN

## 2020-06-06 MED ORDER — FENTANYL CITRATE (PF) 100 MCG/2ML IJ SOLN: 25.0000 ug | INTRAMUSCULAR | Status: DC | PRN

## 2020-06-06 MED ORDER — PROPOFOL 500 MG/50ML IV EMUL
INTRAVENOUS | Status: DC | PRN
  Administered 2020-06-06: 100 ug/kg/min via INTRAVENOUS

## 2020-06-06 MED ORDER — FENTANYL CITRATE (PF) 100 MCG/2ML IJ SOLN
INTRAMUSCULAR | Status: DC | PRN
  Administered 2020-06-06 (×2): 50 ug via INTRAVENOUS

## 2020-06-06 MED ORDER — ACETAMINOPHEN 160 MG/5ML PO SOLN: 325.0000 mg | ORAL | Status: DC | PRN

## 2020-06-06 MED ORDER — POVIDONE-IODINE 7.5 % EX SOLN: Freq: Once | CUTANEOUS | Status: DC

## 2020-06-06 MED ORDER — CEFAZOLIN SODIUM-DEXTROSE 2-4 GM/100ML-% IV SOLN
2.0000 g | INTRAVENOUS | Status: AC
  Administered 2020-06-06: 2 g via INTRAVENOUS

## 2020-06-06 MED ORDER — ACETAMINOPHEN 325 MG PO TABS: 325.0000 mg | ORAL_TABLET | ORAL | Status: DC | PRN

## 2020-06-06 SURGICAL SUPPLY — 24 items
APL SKNCLS STERI-STRIP NONHPOA (GAUZE/BANDAGES/DRESSINGS) ×1
BENZOIN TINCTURE PRP APPL 2/3 (GAUZE/BANDAGES/DRESSINGS) ×2 IMPLANT
BNDG CMPR 75X41 PLY HI ABS (GAUZE/BANDAGES/DRESSINGS) ×1
BNDG COHESIVE 4X5 TAN STRL (GAUZE/BANDAGES/DRESSINGS) ×2 IMPLANT
BNDG ESMARK 4X12 TAN STRL LF (GAUZE/BANDAGES/DRESSINGS) ×2 IMPLANT
BNDG STRETCH 4X75 STRL LF (GAUZE/BANDAGES/DRESSINGS) ×2 IMPLANT
CUFF TOURN SGL QUICK 18X4 (TOURNIQUET CUFF) ×2 IMPLANT
DURAPREP 26ML APPLICATOR (WOUND CARE) ×2 IMPLANT
ELECT REM PT RETURN 9FT ADLT (ELECTROSURGICAL) ×2
ELECTRODE REM PT RTRN 9FT ADLT (ELECTROSURGICAL) ×1 IMPLANT
GAUZE SPONGE 4X4 12PLY STRL (GAUZE/BANDAGES/DRESSINGS) ×2 IMPLANT
GAUZE XEROFORM 1X8 LF (GAUZE/BANDAGES/DRESSINGS) ×2 IMPLANT
GLOVE SURG LX 7.0 MICRO (GLOVE) ×1
GLOVE SURG LX STRL 7.0 MICRO (GLOVE) ×1 IMPLANT
GLOVE SURG ORTHO 7.0 STRL STRW (GLOVE) ×2 IMPLANT
GOWN STRL REUS W/ TWL LRG LVL3 (GOWN DISPOSABLE) ×2 IMPLANT
GOWN STRL REUS W/TWL LRG LVL3 (GOWN DISPOSABLE) ×4
KIT TURNOVER KIT A (KITS) ×2 IMPLANT
NS IRRIG 500ML POUR BTL (IV SOLUTION) ×2 IMPLANT
PACK EXTREMITY ARMC (MISCELLANEOUS) ×2 IMPLANT
PENCIL SMOKE EVACUATOR (MISCELLANEOUS) ×2 IMPLANT
STOCKINETTE IMPERVIOUS LG (DRAPES) ×2 IMPLANT
STRIP CLOSURE SKIN 1/4X4 (GAUZE/BANDAGES/DRESSINGS) ×2 IMPLANT
SUT ETHILON 4 0 PS 2 18 (SUTURE) ×2 IMPLANT

## 2020-06-06 NOTE — Anesthesia Preprocedure Evaluation (Addendum)
Anesthesia Evaluation  Patient identified by MRN, date of birth, ID band Patient awake    Reviewed: Allergy & Precautions, NPO status , Patient's Chart, lab work & pertinent test results  Airway Mallampati: II  TM Distance: >3 FB     Dental  (+) Edentulous Upper, Edentulous Lower   Pulmonary Current SmokerPatient did not abstain from smoking.,    breath sounds clear to auscultation       Cardiovascular  Rhythm:Regular Rate:Normal  HLD   Neuro/Psych    GI/Hepatic GERD  ,  Endo/Other  Hypothyroidism   Renal/GU      Musculoskeletal   Abdominal   Peds  Hematology   Anesthesia Other Findings   Reproductive/Obstetrics                            Anesthesia Physical Anesthesia Plan  ASA: III  Anesthesia Plan: General   Post-op Pain Management:    Induction: Intravenous  PONV Risk Score and Plan: Propofol infusion, TIVA and Treatment may vary due to age or medical condition  Airway Management Planned: Natural Airway and Nasal Cannula  Additional Equipment:   Intra-op Plan:   Post-operative Plan:   Informed Consent: I have reviewed the patients History and Physical, chart, labs and discussed the procedure including the risks, benefits and alternatives for the proposed anesthesia with the patient or authorized representative who has indicated his/her understanding and acceptance.       Plan Discussed with: CRNA  Anesthesia Plan Comments:         Anesthesia Quick Evaluation

## 2020-06-06 NOTE — Op Note (Signed)
PODIATRY / FOOT AND ANKLE SURGERY OPERATIVE REPORT    SURGEON: Caroline More, DPM  PRE-OPERATIVE DIAGNOSIS:  1.  Left fifth toe soft tissue mass  POST-OPERATIVE DIAGNOSIS: Same  PROCEDURE(S): 1. Left fifth toe soft tissue mass resection  HEMOSTASIS: Left ankle tourniquet  ANESTHESIA: MAC  ESTIMATED BLOOD LOSS: 5 cc  FINDING(S): 1.  Left soft tissue mass fifth toe  PATHOLOGY/SPECIMEN(S): Left fifth toe soft tissue mass path specimen  INDICATIONS:   William Ochoa is a 60 y.o. male who presents with a large soft tissue mass to the left fifth toe that patient states has been getting larger and larger over time.  Patient notes that he is had it for many many years and it starting to get to the point where it is bothering him a lot and he cannot wear shoes because it rubs against the shoe and shoes just do not fit well in general.  Discussed all treatment options with patient both conservative and surgical attempts at correction include potential risks and complications at this time patient has agreed for surgery consisting of left fifth toe soft tissue mass resection.  Discussed potential complications that can occur as well as postoperative course in detail.  Also discussed smoking cessation preoperatively as well as prior to procedure.  Patient agreeable to procedure and presents today for intervention.  All questions answered no guarantees given.  Consent signed and placed in chart.  DESCRIPTION: After obtaining full informed written consent, the patient was brought back to the operating room and placed supine upon the operating table.  The patient received IV antibiotics prior to induction.  Left ankle tourniquet was applied.  After obtaining adequate anesthesia, the patient was prepped and draped in the standard fashion.  A preoperative block was performed with 10 cc of half percent Marcaine plain and a left fifth toe digital block.  An Esmarch bandage used to exsanguinate the left  lower extremity and the pneumatic ankle tourniquet was inflated.  Attention was then directed to the soft tissue mass of the left fifth toe where 2 converging semielliptical incisions were made and a 3-1 type of ellipse.  The ellipse was made around the soft tissue mass creating viable skin edges for potential closure.  Once the skin was incised blunt dissection was continued through the subcutaneous tissue following the soft tissue mass which appeared to be within the subcutaneous tissue.  The soft tissue mass was dissected free out and resected and passed off the operative site.  The soft tissue mass that was resected was passed off the operative site and sent off to pathology.  The surgical area was explored further for any residual soft tissue mass type material but none was present this time.  The soft tissue mass appeared to be hard and yellow with some fatty deposits present.  No neurovascular structures were noted at this area within the soft tissue mass.  The surgical site was flushed with copious amounts normal sterile saline.  The skin was then reapproximated well coapted with 4-0 nylon in simple type stitching.  An additional 10 cc of half percent Marcaine plain was injected about the fifth ray in a reverse Mayo type block.  The pneumatic ankle tourniquet was deflated and a prompt hyperemic response was noted all digits left foot.  Patient tolerated the procedure and anesthesia well was transferred to the recovery room vital signs stable vascular status intact all toes left foot.  Following appear to postoperative monitoring the patient be discharged home with  the appropriate orders and discharge instructions as well as medications.  Patient is to keep dressings clean, dry, and intact until first postoperative visit and is to be weightbearing as tolerated with heel contact in a surgical shoe but trying to stay off of his feet as much possible to ensure proper incision and wound healing.  Also once  again stressed smoking cessation in detail with patient.  Patient to follow-up in clinic within 1 week.  COMPLICATIONS: None  CONDITION: Good, stable  Caroline More, DPM

## 2020-06-06 NOTE — H&P (Signed)
HISTORY AND PHYSICAL INTERVAL NOTE:  06/06/2020  9:58 AM  William Ochoa  has presented today for surgery, with the diagnosis of M79.89 LEFT SOFT TISSUE MASS.  The various methods of treatment have been discussed with the patient.  No guarantees were given.  After consideration of risks, benefits and other options for treatment, the patient has consented to surgery.  I have reviewed the patients' chart and labs.    PROCEDURE: LEFT 5TH TOE SOFT TISSUE MASS RESECTION  A history and physical examination was performed in my office.  The patient was reexamined.  There have been no changes to this history and physical examination.  Discussed continued smoking cessation in detail and risks as well as complications, no guarantees given.  Caroline More, DPM

## 2020-06-06 NOTE — Discharge Instructions (Signed)
[place] DR. TROXLER, DR. Vickki Muff, AND DR. Craig   1. Take your medication as prescribed.  Pain medication should be taken only as needed.  You may also take ibuprofen as well as Tylenol to supplement pain relief if still having pain or discomfort.  The maximum dose of Tylenol or acetaminophen per day is 4000 mg and the maximum dose of ibuprofen is 3200 mg.  2. Keep the dressing clean, dry and intact.  Do not change dressing.  Keep surgical dressing clean, dry, and intact until postoperative visit.  3. Keep your foot elevated above the heart level for the first 48 hours.  Continue elevation after that time for reduction in swelling.  4. Walking to the bathroom and brief periods of walking are acceptable, unless we have instructed you to be non-weight bearing.  It is okay to sleep or rest without the surgical shoe on but if up and moving around you have to use the surgical shoe.  5. Always wear your post-op shoe when walking.  Okay to be partial weightbearing with heel contact and surgical shoe to try and avoid pressure on the fifth toe when walking.  Try to only walk for short distances as the more you are on your foot the more that the incision is potentially likely not heal properly.  6. Do not take a shower. Baths are permissible as long as the foot is kept out of the water.   7. Continue smoking cessation.  Smoking during the postoperative period can sometimes slow healing down and lead to wound complications and healing complications.  This was discussed preoperatively as well.  8. Every hour you are awake:  - Bend your knee 15 times. - Flex foot 15 times - Massage calf 15 times  9. Call [facility] [phone]) if any of the following problems occur: - You develop a temperature or fever. - The bandage becomes saturated with blood. - Medication does not stop your  pain. - Injury of the foot occurs. - Any symptoms of infection including redness, odor, or red streaks running from wound.   General Anesthesia, Adult, Care After This sheet gives you information about how to care for yourself after your procedure. Your health care provider may also give you more specific instructions. If you have problems or questions, contact your health care provider. What can I expect after the procedure? After the procedure, the following side effects are common:  Pain or discomfort at the IV site.  Nausea.  Vomiting.  Sore throat.  Trouble concentrating.  Feeling cold or chills.  Feeling weak or tired.  Sleepiness and fatigue.  Soreness and body aches. These side effects can affect parts of the body that were not involved in surgery. Follow these instructions at home: For the time period you were told by your health care provider:  Rest.  Do not participate in activities where you could fall or become injured.  Do not drive or use machinery.  Do not drink alcohol.  Do not take sleeping pills or medicines that cause drowsiness.  Do not make important decisions or sign legal documents.  Do not take care of children on your own.   Eating and drinking  Follow any instructions from your health care provider about eating or drinking restrictions.  When you feel hungry, start by eating small amounts of foods that are soft and easy to digest (bland), such as toast. Gradually  return to your regular diet.  Drink enough fluid to keep your urine pale yellow.  If you vomit, rehydrate by drinking water, juice, or clear broth. General instructions  If you have sleep apnea, surgery and certain medicines can increase your risk for breathing problems. Follow instructions from your health care provider about wearing your sleep device: ? Anytime you are sleeping, including during daytime naps. ? While taking prescription pain medicines, sleeping medicines,  or medicines that make you drowsy.  Have a responsible adult stay with you for the time you are told. It is important to have someone help care for you until you are awake and alert.  Return to your normal activities as told by your health care provider. Ask your health care provider what activities are safe for you.  Take over-the-counter and prescription medicines only as told by your health care provider.  If you smoke, do not smoke without supervision.  Keep all follow-up visits as told by your health care provider. This is important. Contact a health care provider if:  You have nausea or vomiting that does not get better with medicine.  You cannot eat or drink without vomiting.  You have pain that does not get better with medicine.  You are unable to pass urine.  You develop a skin rash.  You have a fever.  You have redness around your IV site that gets worse. Get help right away if:  You have difficulty breathing.  You have chest pain.  You have blood in your urine or stool, or you vomit blood. Summary  After the procedure, it is common to have a sore throat or nausea. It is also common to feel tired.  Have a responsible adult stay with you for the time you are told. It is important to have someone help care for you until you are awake and alert.  When you feel hungry, start by eating small amounts of foods that are soft and easy to digest (bland), such as toast. Gradually return to your regular diet.  Drink enough fluid to keep your urine pale yellow.  Return to your normal activities as told by your health care provider. Ask your health care provider what activities are safe for you. This information is not intended to replace advice given to you by your health care provider. Make sure you discuss any questions you have with your health care provider. Document Revised: 12/07/2019 Document Reviewed: 07/06/2019 Elsevier Patient Education  2021 [employer].

## 2020-06-06 NOTE — Anesthesia Preprocedure Evaluation (Deleted)
Anesthesia Evaluation  Patient identified by MRN, date of birth, ID band Patient awake    Reviewed: Allergy & Precautions, NPO status , Patient's Chart, lab work & pertinent test results  Airway Mallampati: II  TM Distance: >3 FB     Dental   Pulmonary Current SmokerPatient did not abstain from smoking.,    breath sounds clear to auscultation       Cardiovascular  Rhythm:Regular Rate:Normal  HLD   Neuro/Psych    GI/Hepatic GERD (food related)  ,  Endo/Other  Hypothyroidism   Renal/GU      Musculoskeletal   Abdominal   Peds  Hematology   Anesthesia Other Findings   Reproductive/Obstetrics                             Anesthesia Physical Anesthesia Plan  ASA: II  Anesthesia Plan: General   Post-op Pain Management:    Induction: Intravenous  PONV Risk Score and Plan: Propofol infusion, TIVA and Treatment may vary due to age or medical condition  Airway Management Planned: Natural Airway and Nasal Cannula  Additional Equipment:   Intra-op Plan:   Post-operative Plan:   Informed Consent: I have reviewed the patients History and Physical, chart, labs and discussed the procedure including the risks, benefits and alternatives for the proposed anesthesia with the patient or authorized representative who has indicated his/her understanding and acceptance.       Plan Discussed with: CRNA  Anesthesia Plan Comments:         Anesthesia Quick Evaluation

## 2020-06-06 NOTE — Anesthesia Procedure Notes (Signed)
Procedure Name: MAC Date/Time: 06/06/2020 10:22 AM Performed by: Silvana Newness, CRNA Pre-anesthesia Checklist: Patient identified, Emergency Drugs available, Suction available, Patient being monitored and Timeout performed Patient Re-evaluated:Patient Re-evaluated prior to induction Oxygen Delivery Method: Simple face mask Induction Type: IV induction Placement Confirmation: positive ETCO2

## 2020-06-06 NOTE — Anesthesia Postprocedure Evaluation (Signed)
Anesthesia Post Note  Patient: William Ochoa  Procedure(s) Performed: EXCISION TUMOR FOOT-SUBQ LEFT (Left Foot)     Patient location during evaluation: PACU Anesthesia Type: General Level of consciousness: awake Pain management: pain level controlled Vital Signs Assessment: post-procedure vital signs reviewed and stable Respiratory status: respiratory function stable Cardiovascular status: stable Postop Assessment: no signs of nausea or vomiting Anesthetic complications: no   No complications documented.  Veda Canning

## 2020-06-06 NOTE — Transfer of Care (Signed)
Immediate Anesthesia Transfer of Care Note  Patient: William Ochoa  Procedure(s) Performed: EXCISION TUMOR FOOT-SUBQ LEFT (Left Foot)  Patient Location: PACU  Anesthesia Type: General  Level of Consciousness: awake, alert  and patient cooperative  Airway and Oxygen Therapy: Patient Spontanous Breathing and Patient connected to supplemental oxygen  Post-op Assessment: Post-op Vital signs reviewed, Patient's Cardiovascular Status Stable, Respiratory Function Stable, Patent Airway and No signs of Nausea or vomiting  Post-op Vital Signs: Reviewed and stable  Complications: No complications documented.

## 2020-06-07 ENCOUNTER — Encounter: Payer: Self-pay | Admitting: Podiatry

## 2020-06-10 LAB — SURGICAL PATHOLOGY

## 2020-10-03 ENCOUNTER — Ambulatory Visit (HOSPITAL_COMMUNITY)
Admission: EM | Admit: 2020-10-03 | Discharge: 2020-10-03 | Disposition: A | Discharge status: Home / Self Care | Payer: 59

## 2020-10-03 ENCOUNTER — Emergency Department (HOSPITAL_COMMUNITY): Payer: 59

## 2020-10-03 ENCOUNTER — Other Ambulatory Visit: Payer: Self-pay

## 2020-10-03 ENCOUNTER — Encounter (HOSPITAL_COMMUNITY): Payer: Self-pay

## 2020-10-03 ENCOUNTER — Emergency Department (HOSPITAL_COMMUNITY)
Admission: EM | Admit: 2020-10-03 | Discharge: 2020-10-03 | Disposition: A | Discharge status: Home / Self Care | Payer: 59 | Attending: Emergency Medicine | Admitting: Emergency Medicine

## 2020-10-03 DIAGNOSIS — E039 Hypothyroidism, unspecified: Secondary | ICD-10-CM | POA: Insufficient documentation

## 2020-10-03 DIAGNOSIS — M79604 Pain in right leg: Secondary | ICD-10-CM | POA: Diagnosis present

## 2020-10-03 DIAGNOSIS — M79605 Pain in left leg: Secondary | ICD-10-CM | POA: Diagnosis not present

## 2020-10-03 DIAGNOSIS — R202 Paresthesia of skin: Secondary | ICD-10-CM | POA: Insufficient documentation

## 2020-10-03 DIAGNOSIS — F17210 Nicotine dependence, cigarettes, uncomplicated: Secondary | ICD-10-CM | POA: Diagnosis not present

## 2020-10-03 DIAGNOSIS — Z79899 Other long term (current) drug therapy: Secondary | ICD-10-CM | POA: Insufficient documentation

## 2020-10-03 DIAGNOSIS — G453 Amaurosis fugax: Secondary | ICD-10-CM | POA: Diagnosis not present

## 2020-10-03 DIAGNOSIS — M48061 Spinal stenosis, lumbar region without neurogenic claudication: Secondary | ICD-10-CM

## 2020-10-03 LAB — CBC WITH DIFFERENTIAL/PLATELET
Abs Immature Granulocytes: 0.01 10*3/uL (ref 0.00–0.07)
Basophils Absolute: 0 10*3/uL (ref 0.0–0.1)
Basophils Relative: 1 %
Eosinophils Absolute: 0.2 10*3/uL (ref 0.0–0.5)
Eosinophils Relative: 3 %
HCT: 41.6 % (ref 39.0–52.0)
Hemoglobin: 14 g/dL (ref 13.0–17.0)
Immature Granulocytes: 0 %
Lymphocytes Relative: 41 %
Lymphs Abs: 2.5 10*3/uL (ref 0.7–4.0)
MCH: 32.3 pg (ref 26.0–34.0)
MCHC: 33.7 g/dL (ref 30.0–36.0)
MCV: 96.1 fL (ref 80.0–100.0)
Monocytes Absolute: 0.5 10*3/uL (ref 0.1–1.0)
Monocytes Relative: 8 %
Neutro Abs: 2.9 10*3/uL (ref 1.7–7.7)
Neutrophils Relative %: 47 %
Platelets: 247 10*3/uL (ref 150–400)
RBC: 4.33 MIL/uL (ref 4.22–5.81)
RDW: 13.7 % (ref 11.5–15.5)
WBC: 6.3 10*3/uL (ref 4.0–10.5)
nRBC: 0 % (ref 0.0–0.2)

## 2020-10-03 LAB — COMPREHENSIVE METABOLIC PANEL
ALT: 17 U/L (ref 0–44)
AST: 19 U/L (ref 15–41)
Albumin: 3.5 g/dL (ref 3.5–5.0)
Alkaline Phosphatase: 61 U/L (ref 38–126)
Anion gap: 7 (ref 5–15)
BUN: 13 mg/dL (ref 8–23)
CO2: 26 mmol/L (ref 22–32)
Calcium: 8.9 mg/dL (ref 8.9–10.3)
Chloride: 103 mmol/L (ref 98–111)
Creatinine, Ser: 0.94 mg/dL (ref 0.61–1.24)
GFR, Estimated: >60 mL/min (ref >60)
Glucose, Bld: 109 mg/dL — ABNORMAL HIGH (ref 70–99)
Potassium: 3.9 mmol/L (ref 3.5–5.1)
Sodium: 136 mmol/L (ref 135–145)
Total Bilirubin: 0.7 mg/dL (ref 0.3–1.2)
Total Protein: 5.8 g/dL — ABNORMAL LOW (ref 6.5–8.1)

## 2020-10-03 LAB — PROTIME-INR
INR: 0.9 (ref 0.8–1.2)
Prothrombin Time: 12.5 seconds (ref 11.4–15.2)

## 2020-10-03 LAB — APTT: aPTT: 27 seconds (ref 24–36)

## 2020-10-03 MED ORDER — PREDNISONE 20 MG PO TABS: ORAL_TABLET | ORAL | 0 refills | Status: DC

## 2020-10-03 MED ORDER — IOHEXOL 350 MG/ML SOLN
50.0000 mL | Freq: Once | INTRAVENOUS | Status: AC | PRN
  Administered 2020-10-03: 50 mL via INTRAVENOUS

## 2020-10-03 NOTE — ED Provider Notes (Signed)
Emergency Medicine Provider Triage Evaluation Note  William Ochoa , a 60 y.o. male  was evaluated in triage.  Pt complains of burning tingling sensation to bilateral lower EXTR.  Patient reports that symptoms began 730 this morning and constant since then.  Patient denies any facial asymmetry, slurred speech, weakness, seizures, visual disturbance.  Patient reports that 2 weeks prior he had an episode of bilateral vision loss that lasted for a few minutes.  Patient denies any history of seizure.  Review of Systems  Positive: Numbness/tingling sensation to bilateral lower extremities Negative: facial asymmetry, slurred speech, weakness, seizures, visual disturbance  Physical Exam  BP 100/69 (BP Location: Right Arm)   Pulse 78   Temp 97.8 F (36.6 C) (Oral)   Resp 17   SpO2 98%  Gen:   Awake, no distress   Resp:  Normal effort  MSK:   Moves extremities without difficulty  Other:  .+5 Strength to bilateral upper and lower extremities.  No pronator drift.  Sensation to light touch intact to bilateral upper and lower extremities.  No facial asymmetry or slurred speech.  Patient has irregular pupil to left eye that is unreactive.  Patient states this is baseline for him due to history of multiple surgeries.  Medical Decision Making  Medically screening exam initiated at 3:55 PM.  Appropriate orders placed.  William Ochoa was informed that the remainder of the evaluation will be completed by another provider, this initial triage assessment does not replace that evaluation, and the importance of remaining in the ED until their evaluation is complete.  The patient appears stable so that the remainder of the work up may be completed by another provider.      Loni Beckwith, PA-C 10/03/20 1557    Dorie Rank, MD 10/04/20 [phone]

## 2020-10-03 NOTE — ED Triage Notes (Addendum)
Pt reports episode of blindness 2 weeks ago.  Reports dizziness and burning from belt down to legs with both feet numbness onset 0730 this morning as well as sudden onset of low back pain.  Reports scheduled for CT to check for blood clots but that this routine.  Pt reports history of drug and alcohol abuse but that he quit 28 years ago.

## 2020-10-03 NOTE — Discharge Instructions (Addendum)
Please read and follow all provided instructions.  Your diagnoses today include:  1. Spinal stenosis of lumbar region, unspecified whether neurogenic claudication present   2. Paresthesia   3. Amaurosis fugax, both eyes     Tests performed today include: Vital signs - see below for your results today Blood cell counts (white, red, and platelets) Electrolytes  Kidney function test Urine test to check for infection CT head and neck -looks normal without any blocked blood vessels in the head or neck MRI of your middle and lower back -in your lower back you have multiple areas of bulging disks which may have contributed to your lower extremity symptoms today  Medications prescribed:  Prednisone - steroid medicine   It is best to take this medication in the morning to prevent sleeping problems. If you are diabetic, monitor your blood sugar closely and stop taking Prednisone if blood sugar is over 300. Take with food to prevent stomach upset.   Take any prescribed medications only as directed.  Home care instructions:  Follow any educational materials contained in this packet Please rest, use ice or heat on your back for the next several days Do not lift, push, pull anything more than 10 pounds for the next week  Follow-up instructions: Please follow-up with your primary care provider in the next 3 days to discuss your symptoms.  I have provided a referral to neurosurgery in regards to your back and lower extremity symptoms and abnormal MRI.  Return instructions:  SEEK IMMEDIATE MEDICAL ATTENTION IF YOU HAVE: New numbness, tingling, weakness, or problem with the use of your arms or legs Severe back pain not relieved with medications Loss control of your bowels or bladder Increasing pain in any areas of the body (such as chest or abdominal pain) Shortness of breath, dizziness, or fainting.  Worsening nausea (feeling sick to your stomach), vomiting, fever, or sweats Any other emergent  concerns regarding your health   Additional Information:  Your vital signs today were: BP 114/71   Pulse (!) 56   Temp 97.8 F (36.6 C) (Oral)   Resp 17   SpO2 93%  If your blood pressure (BP) was elevated above 135/85 this visit, please have this repeated by your doctor within one month. --------------
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
## 2020-10-03 NOTE — ED Notes (Signed)
Patient transported to MRI 
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
## 2020-10-03 NOTE — ED Notes (Signed)
Called report to ED charge RN Magda Paganini.

## 2020-10-03 NOTE — ED Notes (Signed)
Patient is being discharged from the Urgent Care and sent to the Emergency Department via POV. Per Aldie PA, patient is in need of higher level of care due to dizziness, bilateral leg numbness. Patient is aware and verbalizes understanding of plan of care.  Vitals:   10/03/20 1414  BP: 125/76  Pulse: 81  Resp: 18  Temp: 98.5 F (36.9 C)  SpO2: 94%
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
## 2021-05-26 ENCOUNTER — Encounter: Payer: Self-pay | Admitting: *Deleted

## 2021-07-02 ENCOUNTER — Other Ambulatory Visit: Payer: Self-pay

## 2021-07-02 ENCOUNTER — Ambulatory Visit
Admission: EM | Admit: 2021-07-02 | Discharge: 2021-07-02 | Disposition: A | Discharge status: Home / Self Care | Payer: 59

## 2021-07-02 DIAGNOSIS — M545 Low back pain, unspecified: Secondary | ICD-10-CM | POA: Insufficient documentation

## 2021-07-02 DIAGNOSIS — M7061 Trochanteric bursitis, right hip: Secondary | ICD-10-CM | POA: Insufficient documentation

## 2021-07-02 NOTE — ED Triage Notes (Signed)
Patient is here for "Right hip pain" (known/chronic pain). Possible previous injury. No other symptoms. Requests "Cortisol shot".  ?

## 2022-04-07 IMAGING — CT CT HEAD W/O CM
3 series · 16 of 47 positions shown, 19 images · non-contrast
Comparison: CT head 03/17/2011

CLINICAL DATA: Acute neuro deficit.  Dizziness

EXAM:
CT HEAD WITHOUT CONTRAST
TECHNIQUE: Contiguous axial images were obtained from the base of the skull
through the vertex without intravenous contrast.

[Series 3: head 5.0 h30s · axial · 0.45mm/px · z∈[-144,+6]mm · 10 of 36 slices shown, 13 images]
[im 3/36  brain]
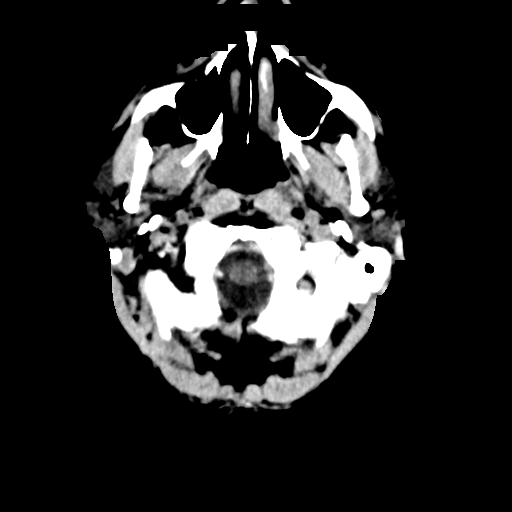
[im 3/36  bone]
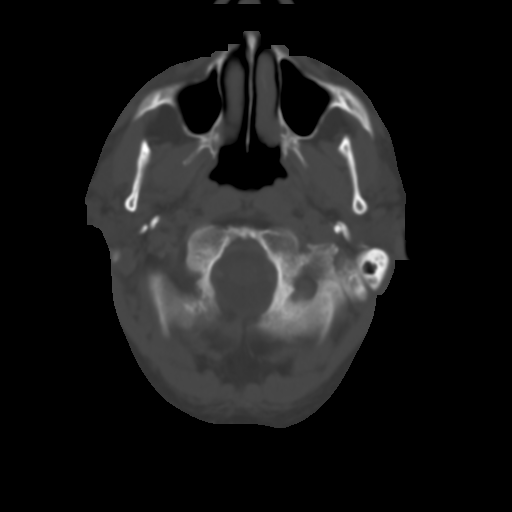
[im 7/36  brain]
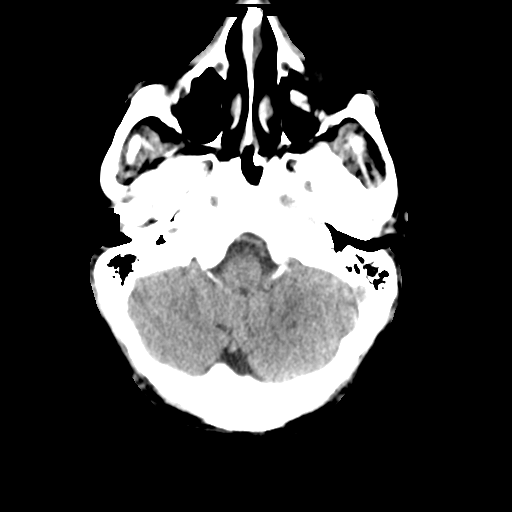
[im 10/36  brain]
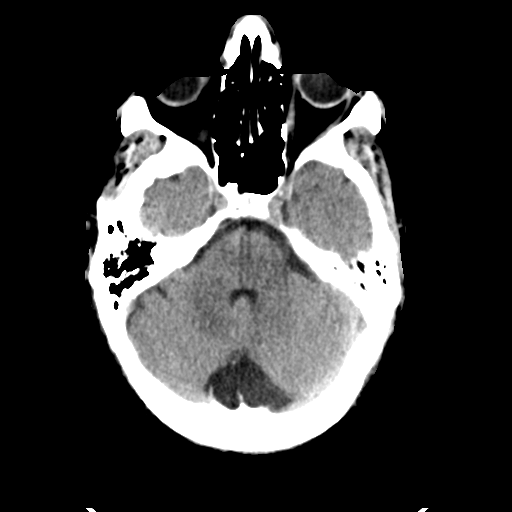
[im 13/36  brain]
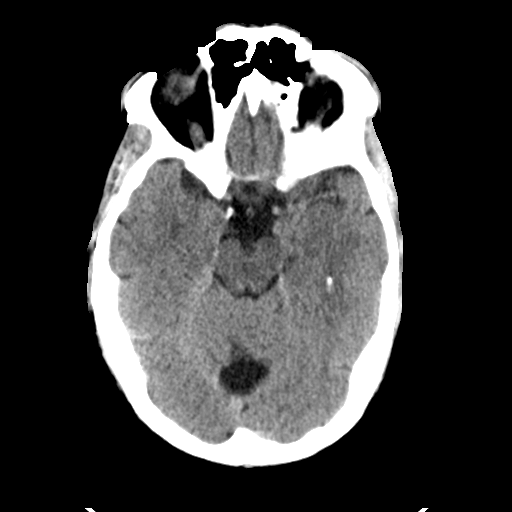
[im 16/36  brain]
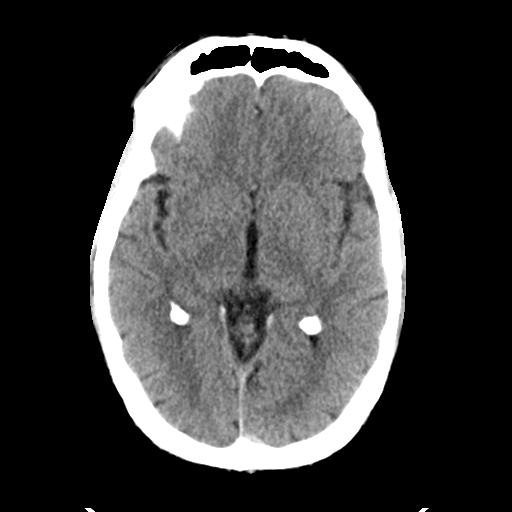
[im 16/36  bone]
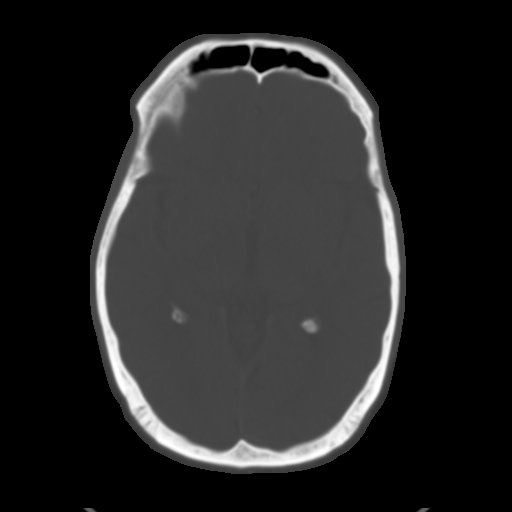
[im 20/36  brain]
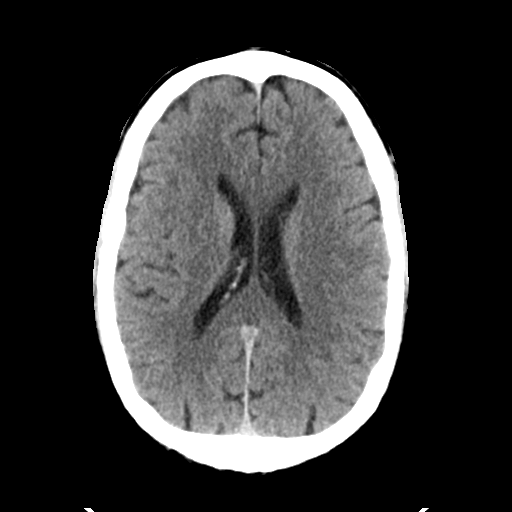
[im 23/36  brain]
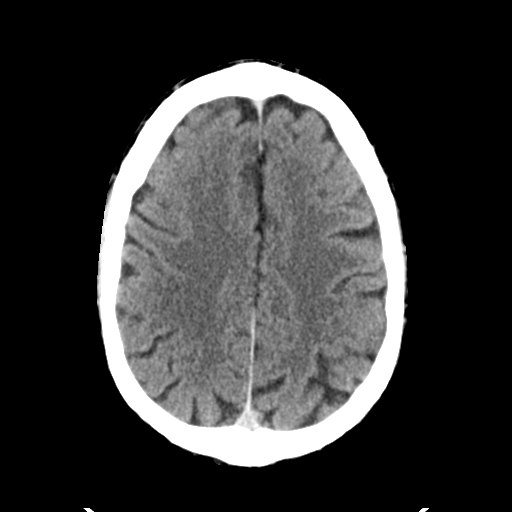
[im 27/36  brain]
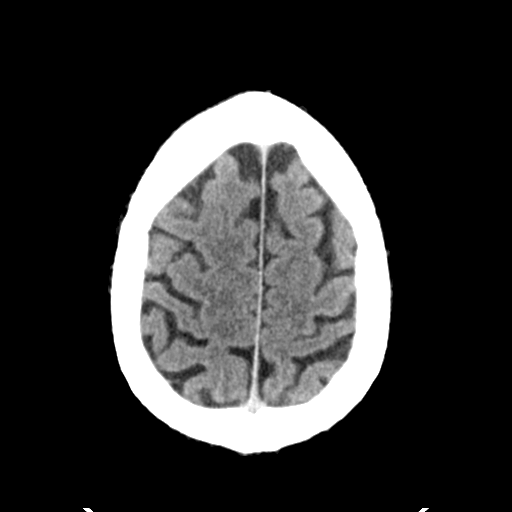
[im 29/36  brain]
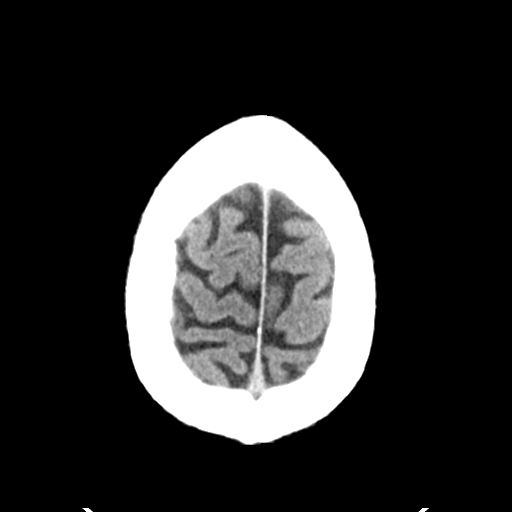
[im 29/36  bone]
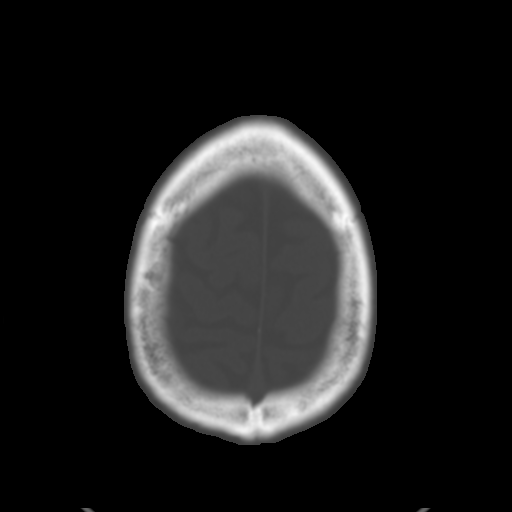
[im 33/36  brain]
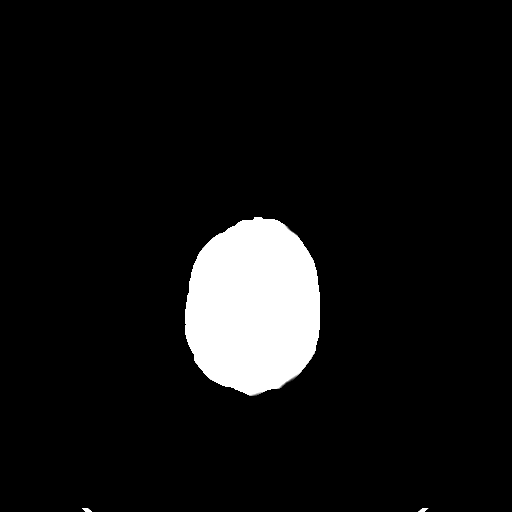

[Series 5: head 3.0 mpr cor · coronal · 0.35mm/px · 3 of 71 slices shown]
[im 24/71  brain]
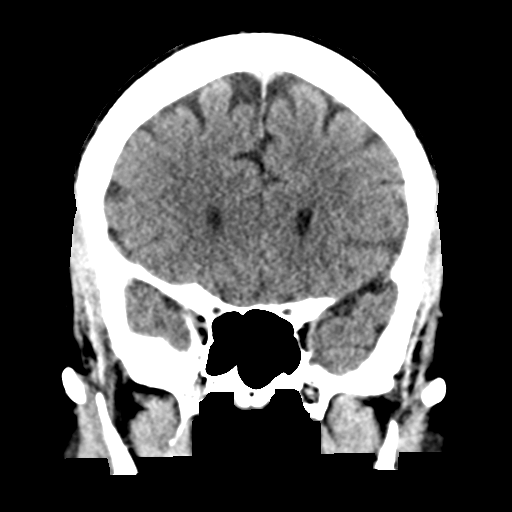
[im 32/71  brain]
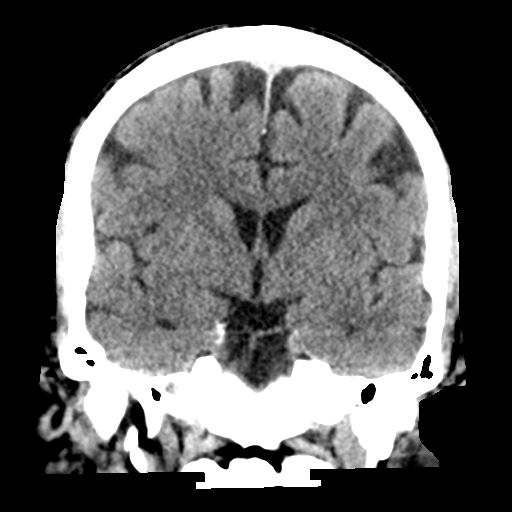
[im 39/71  brain]
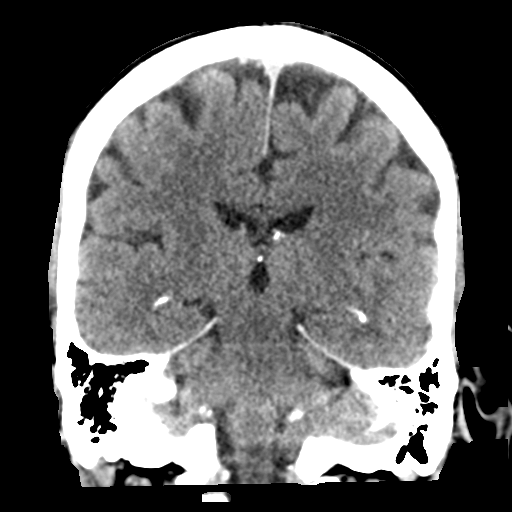

[Series 6: head 3.0 mpr sag · sagittal · 0.34mm/px · 3 of 60 slices shown]
[im 20/60  brain]
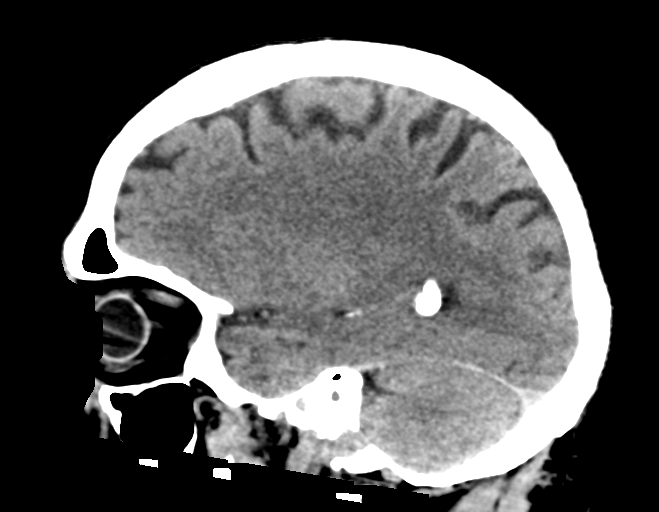
[im 30/60  brain]
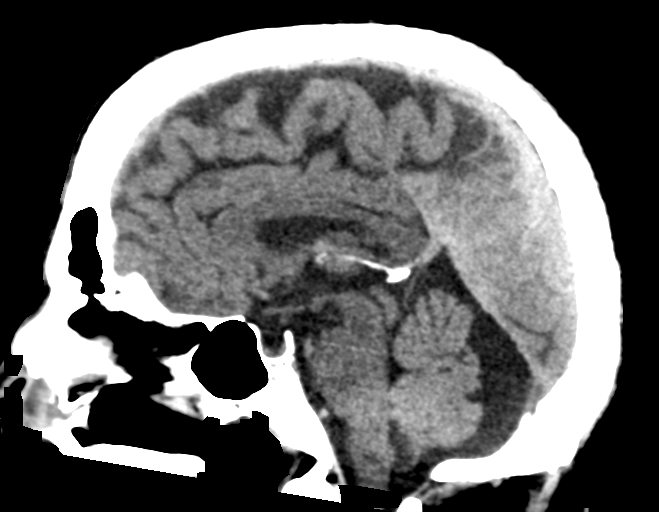
[im 40/60  brain]
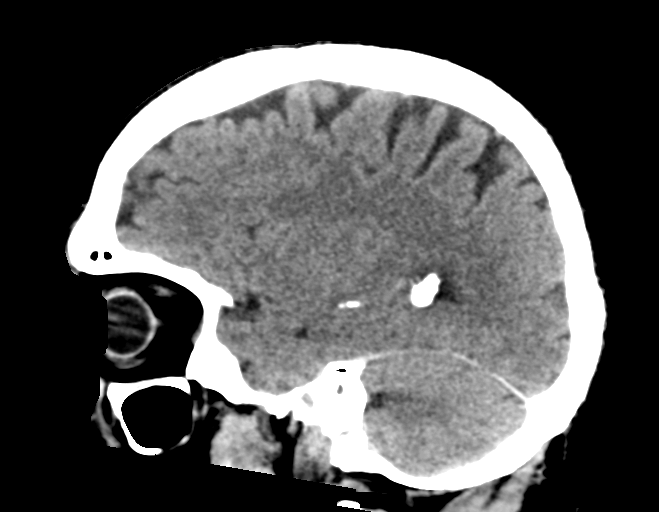

[16 of 47 positions shown; findings below may reference images not displayed]

FINDINGS: Brain: No evidence of acute infarction, hemorrhage, hydrocephalus,
extra-axial collection or mass lesion/mass effect.

Vascular: Negative for hyperdense vessel

Skull: Negative

Sinuses/Orbits: Negative

Other: None
IMPRESSION: Negative CT head

## 2022-07-27 ENCOUNTER — Ambulatory Visit: Payer: 59 | Admitting: Cardiology

## 2022-07-27 ENCOUNTER — Encounter: Payer: Self-pay | Admitting: *Deleted

## 2022-08-03 ENCOUNTER — Other Ambulatory Visit
Admission: RE | Admit: 2022-08-03 | Discharge: 2022-08-03 | Disposition: A | Discharge status: Home / Self Care | Payer: 59 | Source: Ambulatory Visit | Attending: Cardiology | Admitting: Cardiology

## 2022-08-03 ENCOUNTER — Ambulatory Visit: Payer: 59 | Attending: Cardiology | Admitting: Cardiology

## 2022-08-03 ENCOUNTER — Ambulatory Visit: Payer: 59 | Admitting: Cardiology

## 2022-08-03 ENCOUNTER — Encounter: Payer: Self-pay | Admitting: Cardiology

## 2022-08-03 VITALS — BP 112/64 | HR 74 | Ht 68.0 in | Wt 187.4 lb

## 2022-08-03 DIAGNOSIS — F17200 Nicotine dependence, unspecified, uncomplicated: Secondary | ICD-10-CM | POA: Diagnosis not present

## 2022-08-03 DIAGNOSIS — R079 Chest pain, unspecified: Secondary | ICD-10-CM | POA: Diagnosis not present

## 2022-08-03 DIAGNOSIS — R072 Precordial pain: Secondary | ICD-10-CM

## 2022-08-03 DIAGNOSIS — R9431 Abnormal electrocardiogram [ECG] [EKG]: Secondary | ICD-10-CM

## 2022-08-03 DIAGNOSIS — E7800 Pure hypercholesterolemia, unspecified: Secondary | ICD-10-CM

## 2022-08-03 LAB — BASIC METABOLIC PANEL
Anion gap: 6 (ref 5–15)
BUN: 16 mg/dL (ref 8–23)
CO2: 31 mmol/L (ref 22–32)
Calcium: 9 mg/dL (ref 8.9–10.3)
Chloride: 102 mmol/L (ref 98–111)
Creatinine, Ser: 0.81 mg/dL (ref 0.61–1.24)
GFR, Estimated: >60 mL/min (ref >60)
Glucose, Bld: 76 mg/dL (ref 70–99)
Potassium: 4.2 mmol/L (ref 3.5–5.1)
Sodium: 139 mmol/L (ref 135–145)

## 2022-08-03 MED ORDER — EZETIMIBE 10 MG PO TABS: 10.0000 mg | ORAL_TABLET | Freq: Every day | ORAL | 3 refills | Status: DC

## 2022-08-03 MED ORDER — METOPROLOL TARTRATE 100 MG PO TABS: 100.0000 mg | ORAL_TABLET | Freq: Once | ORAL | 0 refills | Status: DC

## 2022-08-03 MED ORDER — IVABRADINE HCL 5 MG PO TABS: 10.0000 mg | ORAL_TABLET | Freq: Once | ORAL | 0 refills | Status: AC

## 2022-08-03 NOTE — Progress Notes (Signed)
Cardiology Office Note:    Date:  08/03/2022   ID:  William Ochoa, DOB 17-Mar-1961, MRN [id number]  PCP:  Gildardo Pounds, PA   [facility] Providers Cardiologist:  Debbe Odea, MD     Referring MD: Lorn Junes, FNP   Chief Complaint  Patient presents with   New Patient (Initial Visit)    Patient had episode of right arm pain/tingles with nausea couple months ago.  Did go to [facility] ED for work up.   William Ochoa is a 62 y.o. male who is being seen today for the evaluation of abnormal EKG, chest pressure at the request of Lorn Junes, FNP.   History of Present Illness:    William Ochoa is a 62 y.o. male with a hx of hyperlipidemia, hypothyroidism, current smoker x 40+ years who presents due to chest pressure and abnormal EKG.  Patient states feeling nauseous about 2 months ago after leaving work.  His wife checked his blood pressure, noted to be elevated at that time.  He denies any history of hypertension.  He went to the [facility], EKG obtained was noted to be abnormal.  He had right arm tingling, which spontaneously resolved with improvement in blood pressure.  His blood pressures have stayed normal since.  He endorses having occasional chest pressure, not associated with exertion.  He is a current smoker.  He does not take statin for cholesterol due to myalgias.  Past Medical History:  Diagnosis Date   GERD (gastroesophageal reflux disease)    rare-no meds   Glaucoma 2015   Hepatitis 1970'S   A OR B-PT CANNOT REMEMBER WHICH ONE   Hyperlipidemia    Hypothyroidism    Thyroid disease    Wears dentures    full upper and lower    Past Surgical History:  Procedure Laterality Date   ANAL FISTULOTOMY N/A 01/03/2018   Procedure: ANAL FISTULOTOMY;  Surgeon: Earline Mayotte, MD;  Location: [facility] ORS;  Service: General;  Laterality: N/A;   ANAL FISTULOTOMY N/A 02/23/2018   Procedure: ANAL FISTULOTOMY;  Surgeon: Earline Mayotte, MD;  Location: [facility] OR[facility];  Service: General;  Laterality: N/A;   ARTHOSCOPIC ROTAOR CUFF REPAIR Right 06/21/2017   Procedure: ARTHROSCOPIC ROTATOR CUFF REPAIR;  Surgeon: Lyndle Herrlich, MD;  Location: [facility] ORS;  Service: Orthopedics;  Laterality: Right;   EVALUATION UNDER ANESTHESIA WITH ANAL FISTULECTOMY N/A 06/23/2018   Procedure: EXAM UNDER ANESTHESIA WITH ANAL FISTULECTOMY;  Surgeon: Earline Mayotte, MD;  Location: [facility] ORS;  Service: General;  Laterality: N/A;   EYE SURGERY Left 2016   CORNEAL SURGERY   FISTULOTOMY N/A 10/01/2015   Procedure: FISTULOTOMY;  Surgeon: Earline Mayotte, MD;  Location: [facility] OR[facility];  Service: General;  Laterality: N/A;   HEMORRHOID SURGERY  1990   INCISION AND DRAINAGE PERIRECTAL ABSCESS N/A 10/01/2015   Procedure: IRRIGATION AND DEBRIDEMENT PERIRECTAL ABSCESS;  Surgeon: Earline Mayotte, MD;  Location: [facility];  Service: General;  Laterality: N/A;   MASS EXCISION Left 06/06/2020   Procedure: EXCISION TUMOR FOOT-SUBQ LEFT;  Surgeon: Rosetta Posner, DPM;  Location: [facility];  Service: Podiatry;  Laterality: Left;   RESECTION DISTAL CLAVICAL  06/21/2017   Procedure: DISTAL CLAVICALEXCISION;  Surgeon: Lyndle Herrlich, MD;  Location: [facility];  Service: Orthopedics;;   SHOULDER ARTHROSCOPY WITH BICEPSTENOTOMY Right 06/21/2017   Procedure: SHOULDER ARTHROSCOPY WITH BICEPSTENOTOMY;  Surgeon: Lyndle Herrlich, MD;  Location: [facility];  Service: Orthopedics;  Laterality: Right;   SHOULDER ARTHROSCOPY WITH OPEN ROTATOR CUFF REPAIR Right 06/21/2017   Procedure: SHOULDER ARTHROSCOPY;  Surgeon: Lyndle Herrlich, MD;  Location: [facility];  Service: Orthopedics;  Laterality: Right;   SIGMOIDOSCOPY N/A 10/01/2015   Procedure: SIGMOIDOSCOPY;  Surgeon: Earline Mayotte, MD;  Location: [facility];  Service: General;  Laterality: N/A;   SUBACROMIAL DECOMPRESSION Right 06/21/2017   Procedure: SUBACROMIAL DECOMPRESSION;  Surgeon: Lyndle Herrlich, MD;  Location: [facility];   Service: Orthopedics;  Laterality: Right;    Current Medications: Current Meds  Medication Sig   Carboxymeth-Glycerin-Polysorb (REFRESH OPTIVE ADVANCED) 0.5-1-0.5 % SOLN Place 1 drop into both eyes 2 (two) times daily as needed (for dry eyes).    Cholecalciferol (VITAMIN D3 PO) Take by mouth daily.   ezetimibe (ZETIA) 10 MG tablet Take 1 tablet (10 mg total) by mouth daily.   ibuprofen (ADVIL,MOTRIN) 200 MG tablet Take 200 mg by mouth every 6 (six) hours as needed for headache or moderate pain.   ivabradine (CORLANOR) 5 MG TABS tablet Take 2 tablets (10 mg total) by mouth once for 1 dose. TWO HOURS PRIOR TO CARDIAC CT   levothyroxine (SYNTHROID) 137 MCG tablet Take 137 mcg by mouth daily.   metoprolol tartrate (LOPRESSOR) 100 MG tablet Take 1 tablet (100 mg total) by mouth once for 1 dose. TWO HOURS PRIOR TO CARDIAC CT   Multiple Vitamins-Minerals (CENTRUM ADULTS PO) Take 1 tablet by mouth daily.    nystatin-triamcinolone ointment (MYCOLOG) Apply 1 application topically 3 (three) times daily.   vardenafil (LEVITRA) 10 MG tablet Take 10 mg by mouth as needed.     Allergies:   Statins   Social History   Socioeconomic History   Marital status: Married    Spouse name: Not on file   Number of children: Not on file   Years of education: Not on file   Highest education level: Not on file  Occupational History   Not on file  Tobacco Use   Smoking status: Every Day    Packs/day: 1.00    Years: 30.00    Additional pack years: 0.00    Total pack years: 30.00    Types: Cigarettes   Smokeless tobacco: Never  Vaping Use   Vaping Use: Never used  Substance and Sexual Activity   Alcohol use: No    Alcohol/week: 0.0 standard drinks of alcohol    Comment: SOBER FOR 25 YEARS   Drug use: No    Comment: H/O COCAINE 25 YEARS AGO   Sexual activity: Not on file  Other Topics Concern   Not on file  Social History Narrative   Not on file   Social Determinants of Health   Financial  Resource Strain: Not on file  Food Insecurity: Not on file  Transportation Needs: Not on file  Physical Activity: Not on file  Stress: Not on file  Social Connections: Not on file     Family History: The patient's family history includes Cancer in his father and mother; Heart attack in his father.  ROS:   Please see the history of present illness.     All other systems reviewed and are negative.  EKGs/Labs/Other Studies Reviewed:    The following studies were reviewed today:   EKG:  EKG is  ordered today.  The ekg ordered today demonstrates normal sinus rhythm, possible old anterior infarct.  Recent Labs: No results found for requested labs within last 365 days.  Recent Lipid Panel No results found for: "  CHOL", "TRIG", "HDL", "CHOLHDL", "VLDL", "LDLCALC", "LDLDIRECT"   Risk Assessment/Calculations:             Physical Exam:    VS:  BP 112/64 (BP Location: Left Arm, Patient Position: Sitting, Cuff Size: Normal)   Pulse 74   Ht 5\' 8"  (1.727 m)   Wt 187 lb 6.4 oz (85 kg)   SpO2 96%   BMI 28.49 kg/m     Wt Readings from Last 3 Encounters:  08/03/22 187 lb 6.4 oz (85 kg)  07/02/21 170 lb (77.1 kg)  06/06/20 171 lb (77.6 kg)     GEN:  Well nourished, well developed in no acute distress HEENT: Normal NECK: No JVD; No carotid bruits CARDIAC: RRR, no murmurs, rubs, gallops RESPIRATORY:  Clear to auscultation without rales, wheezing or rhonchi  ABDOMEN: Soft, non-tender, non-distended MUSCULOSKELETAL:  No edema; No deformity  SKIN: Warm and dry NEUROLOGIC:  Alert and oriented x 3 PSYCHIATRIC:  Normal affect   ASSESSMENT:    1. Precordial pain   2. Abnormal EKG   3. Pure hypercholesterolemia   4. Smoking   5. Chest pain, unspecified type    PLAN:    In order of problems listed above:  Chest pressure, risk factors hyperlipidemia, current smoker.  Get echo, get coronary CTA. EKG showing possible old anterior infarct.  Echo and CTA as  above. Hyperlipidemia, statin intolerance.  Start Zetia. Current smoker, smoking cessation advised.  Follow-up after echo.     Medication Adjustments/Labs and Tests Ordered: Current medicines are reviewed at length with the patient today.  Concerns regarding medicines are outlined above.  Orders Placed This Encounter  Procedures   CT CORONARY MORPH W/CTA COR W/SCORE W/CA W/CM &/OR WO/CM   Basic Metabolic Panel (BMET)   EKG 12-Lead   ECHOCARDIOGRAM COMPLETE   Meds ordered this encounter  Medications   ezetimibe (ZETIA) 10 MG tablet    Sig: Take 1 tablet (10 mg total) by mouth daily.    Dispense:  90 tablet    Refill:  3   metoprolol tartrate (LOPRESSOR) 100 MG tablet    Sig: Take 1 tablet (100 mg total) by mouth once for 1 dose. TWO HOURS PRIOR TO CARDIAC CT    Dispense:  1 tablet    Refill:  0   ivabradine (CORLANOR) 5 MG TABS tablet    Sig: Take 2 tablets (10 mg total) by mouth once for 1 dose. TWO HOURS PRIOR TO CARDIAC CT    Dispense:  2 tablet    Refill:  0    Patient Instructions  Medication Instructions:   START Zetia - take one tablet ( 10mg ) by mouth daily.   *If you need a refill on your cardiac medications before your next appointment, please call your pharmacy*   Lab Work:  Your physician recommends you go to the medical mall for lab work.   If you have labs (blood work) drawn today and your tests are completely normal, you will receive your results only by: MyChart Message (if you have MyChart) OR A paper copy in the mail If you have any lab test that is abnormal or we need to change your treatment, we will call you to review the results.   Testing/Procedures:  Your physician has requested that you have an echocardiogram. Echocardiography is a painless test that uses sound waves to create images of your heart. It provides your doctor with information about the size and shape of your heart and how well your  heart's chambers and valves are working. This  procedure takes approximately one hour. There are no restrictions for this procedure. Please do NOT wear cologne, perfume, aftershave, or lotions (deodorant is allowed). Please arrive 15 minutes prior to your appointment time.    Your cardiac CT will be scheduled at  [facility] [address] [place], [place] [address] [phone]  If scheduled at [facility]-  please arrive 15 mins early for check-in and test prep.   Please follow these instructions carefully (unless otherwise directed):  Hold all erectile dysfunction medications at least 3 days (72 hrs) prior to test. (Ie viagra, cialis, sildenafil, tadalafil, etc) We will administer nitroglycerin during this exam.   On the Night Before the Test: Be sure to Drink plenty of water. Do not consume any caffeinated/decaffeinated beverages or chocolate 12 hours prior to your test. Do not take any antihistamines 12 hours prior to your test.  On the Day of the Test: Drink plenty of water until 1 hour prior to the test. Do not eat any food 1 hour prior to test. You may take your regular medications prior to the test.  Take metoprolol (Lopressor) two hours prior to test. Take Corlanor two hours prior to the test       After the Test: Drink plenty of water. After receiving IV contrast, you may experience a mild flushed feeling. This is normal. On occasion, you may experience a mild rash up to 24 hours after the test. This is not dangerous. If this occurs, you can take Benadryl 25 mg and increase your fluid intake. If you experience trouble breathing, this can be serious. If it is severe call 911 IMMEDIATELY. If it is mild, please call our office.   We will call to schedule your test 2-4 weeks out understanding that some insurance companies will need an authorization prior to the service being performed.   For non-scheduling related questions, please contact the  cardiac imaging nurse navigator should you have any questions/concerns: Rockwell Alexandria, Cardiac Imaging Nurse Navigator Larey Brick, Cardiac Imaging Nurse Navigator [facility] Direct Office Dial: [phone]   For scheduling needs, including cancellations and rescheduling, please call [place], [phone].  Follow-Up: At [facility], you and your health needs are our priority.  As part of our continuing mission to provide you with exceptional heart care, we have created designated Provider Care Teams.  These Care Teams include your primary Cardiologist (physician) and Advanced Practice Providers (APPs -  Physician Assistants and Nurse Practitioners) who all work together to provide you with the care you need, when you need it.  We recommend signing up for the patient portal called "MyChart".  Sign up information is provided on this After Visit Summary.  MyChart is used to connect with patients for Virtual Visits (Telemedicine).  Patients are able to view lab/test results, encounter notes, upcoming appointments, etc.  Non-urgent messages can be sent to your provider as well.   To learn more about what you can do with MyChart, go to [email or web address].    Your next appointment:   2 month(s)  Provider:   You may see Debbe Odea, MD or one of the following Advanced Practice Providers on your designated Care Team:   Nicolasa Ducking, NP Eula Listen, PA-C Cadence Fransico Michael, PA-C Charlsie Quest, NP   Signed, Debbe Odea, MD  08/03/2022 10:59 AM    [facility]

## 2022-08-03 NOTE — Patient Instructions (Signed)
Medication Instructions:   START Zetia - take one tablet ( 10mg ) by mouth daily.   *If you need a refill on your cardiac medications before your next appointment, please call your pharmacy*   Lab Work:  Your physician recommends you go to the medical mall for lab work.   If you have labs (blood work) drawn today and your tests are completely normal, you will receive your results only by: MyChart Message (if you have MyChart) OR A paper copy in the mail If you have any lab test that is abnormal or we need to change your treatment, we will call you to review the results.   Testing/Procedures:  Your physician has requested that you have an echocardiogram. Echocardiography is a painless test that uses sound waves to create images of your heart. It provides your doctor with information about the size and shape of your heart and how well your heart's chambers and valves are working. This procedure takes approximately one hour. There are no restrictions for this procedure. Please do NOT wear cologne, perfume, aftershave, or lotions (deodorant is allowed). Please arrive 15 minutes prior to your appointment time.    Your cardiac CT will be scheduled at  [facility] [address] [place], [place] [address] [phone]  If scheduled at [facility]-  please arrive 15 mins early for check-in and test prep.   Please follow these instructions carefully (unless otherwise directed):  Hold all erectile dysfunction medications at least 3 days (72 hrs) prior to test. (Ie viagra, cialis, sildenafil, tadalafil, etc) We will administer nitroglycerin during this exam.   On the Night Before the Test: Be sure to Drink plenty of water. Do not consume any caffeinated/decaffeinated beverages or chocolate 12 hours prior to your test. Do not take any antihistamines 12 hours prior to your test.  On the Day of the Test: Drink plenty  of water until 1 hour prior to the test. Do not eat any food 1 hour prior to test. You may take your regular medications prior to the test.  Take metoprolol (Lopressor) two hours prior to test. Take Corlanor two hours prior to the test       After the Test: Drink plenty of water. After receiving IV contrast, you may experience a mild flushed feeling. This is normal. On occasion, you may experience a mild rash up to 24 hours after the test. This is not dangerous. If this occurs, you can take Benadryl 25 mg and increase your fluid intake. If you experience trouble breathing, this can be serious. If it is severe call 911 IMMEDIATELY. If it is mild, please call our office.   We will call to schedule your test 2-4 weeks out understanding that some insurance companies will need an authorization prior to the service being performed.   For non-scheduling related questions, please contact the cardiac imaging nurse navigator should you have any questions/concerns: Rockwell Alexandria, Cardiac Imaging Nurse Navigator Larey Brick, Cardiac Imaging Nurse Navigator [facility] Direct Office Dial: [phone]   For scheduling needs, including cancellations and rescheduling, please call [place], [phone].  Follow-Up: At [facility], you and your health needs are our priority.  As part of our continuing mission to provide you with exceptional heart care, we have created designated Provider Care Teams.  These Care Teams include your primary Cardiologist (physician) and Advanced Practice Providers (APPs -  Physician Assistants and Nurse Practitioners) who all work together  to provide you with the care you need, when you need it.  We recommend signing up for the patient portal called "MyChart".  Sign up information is provided on this After Visit Summary.  MyChart is used to connect with patients for Virtual Visits (Telemedicine).  Patients are able to view lab/test  results, encounter notes, upcoming appointments, etc.  Non-urgent messages can be sent to your provider as well.   To learn more about what you can do with MyChart, go to [email or web address].    Your next appointment:   2 month(s)  Provider:   You may see Debbe Odea, MD or one of the following Advanced Practice Providers on your designated Care Team:   Nicolasa Ducking, NP Eula Listen, PA-C Cadence Fransico Michael, PA-C Charlsie Quest, NP

## 2022-08-27 ENCOUNTER — Ambulatory Visit: Payer: 59 | Admitting: Gastroenterology

## 2022-08-27 ENCOUNTER — Other Ambulatory Visit: Payer: Self-pay

## 2022-08-27 ENCOUNTER — Encounter: Payer: Self-pay | Admitting: Gastroenterology

## 2022-08-27 VITALS — BP 123/79 | HR 74 | Temp 97.9°F | Ht 68.0 in | Wt 186.8 lb

## 2022-08-27 DIAGNOSIS — Z1211 Encounter for screening for malignant neoplasm of colon: Secondary | ICD-10-CM | POA: Diagnosis not present

## 2022-08-27 DIAGNOSIS — K648 Other hemorrhoids: Secondary | ICD-10-CM

## 2022-08-27 MED ORDER — NA SULFATE-K SULFATE-MG SULF 17.5-3.13-1.6 GM/177ML PO SOLN: 354.0000 mL | Freq: Once | ORAL | 0 refills | Status: AC

## 2022-08-27 NOTE — Progress Notes (Signed)
Wyline Mood MD, MRCP(U.K) [address]  [address]  [place], [place] [address]  Main: [phone]  Fax: [phone]   Gastroenterology Consultation  Referring Provider:     Gildardo Pounds, PA Primary Care Physician:  Gildardo Pounds, PA Primary Gastroenterologist:  Dr. Wyline Mood  Reason for Consultation:    Hemorrhoids  HPI:   William Ochoa is a 62 y.o. y/o male referred for consultation & management  by Gildardo Pounds, PA.    He has been referred to get evaluated for hemorrhoids.  He states that he is here to talk to me about hemorrhoids which are causing him discomfort in the perianal area for a few months occasionally bleeds when he wipes.'s note real change in his bowel habits no family history of colon cancer never had a colonoscopy.  He recollects that he has had his hemorrhoids treated surgically in the past.  Denies any perianal itching.  No prior colonoscopy reports available on epic.  Review of his records suggest he has had an anal fistulectomy done in 2020 by Dr. Lemar Livings.  At that time he had a perirectal abscess.  MRI of the pelvis at that time showed simple left-sided intersphincteric perianal fistula.  05/26/2022 hemoglobin 15.5 g CMP normal.  Past Medical History:  Diagnosis Date   GERD (gastroesophageal reflux disease)    rare-no meds   Glaucoma 2015   Hepatitis 1970'S   A OR B-PT CANNOT REMEMBER WHICH ONE   Hyperlipidemia    Hypothyroidism    Thyroid disease    Wears dentures    full upper and lower    Past Surgical History:  Procedure Laterality Date   ANAL FISTULOTOMY N/A 01/03/2018   Procedure: ANAL FISTULOTOMY;  Surgeon: Earline Mayotte, MD;  Location: [facility];  Service: General;  Laterality: N/A;   ANAL FISTULOTOMY N/A 02/23/2018   Procedure: ANAL FISTULOTOMY;  Surgeon: Earline Mayotte, MD;  Location: [facility];  Service: General;  Laterality: N/A;   ARTHOSCOPIC ROTAOR CUFF REPAIR Right 06/21/2017   Procedure: ARTHROSCOPIC  ROTATOR CUFF REPAIR;  Surgeon: Lyndle Herrlich, MD;  Location: [facility];  Service: Orthopedics;  Laterality: Right;   EVALUATION UNDER ANESTHESIA WITH ANAL FISTULECTOMY N/A 06/23/2018   Procedure: EXAM UNDER ANESTHESIA WITH ANAL FISTULECTOMY;  Surgeon: Earline Mayotte, MD;  Location: [facility];  Service: General;  Laterality: N/A;   EYE SURGERY Left 2016   CORNEAL SURGERY   FISTULOTOMY N/A 10/01/2015   Procedure: FISTULOTOMY;  Surgeon: Earline Mayotte, MD;  Location: [facility];  Service: General;  Laterality: N/A;   HEMORRHOID SURGERY  1990   INCISION AND DRAINAGE PERIRECTAL ABSCESS N/A 10/01/2015   Procedure: IRRIGATION AND DEBRIDEMENT PERIRECTAL ABSCESS;  Surgeon: Earline Mayotte, MD;  Location: [facility];  Service: General;  Laterality: N/A;   MASS EXCISION Left 06/06/2020   Procedure: EXCISION TUMOR FOOT-SUBQ LEFT;  Surgeon: Rosetta Posner, DPM;  Location[facility];  Service: Podiatry;  Laterality: Left;   RESECTION DISTAL CLAVICAL  06/21/2017   Procedure: DISTAL CLAVICALEXCISION;  Surgeon: Lyndle Herrlich, MD;  Location: [facility];  Service: Orthopedics;;   SHOULDER ARTHROSCOPY WITH BICEPSTENOTOMY Right 06/21/2017   Procedure: SHOULDER ARTHROSCOPY WITH BICEPSTENOTOMY;  Surgeon: Lyndle Herrlich, MD;  Location: [facility];  Service: Orthopedics;  Laterality: Right;   SHOULDER ARTHROSCOPY WITH OPEN ROTATOR CUFF REPAIR Right 06/21/2017   Procedure: SHOULDER ARTHROSCOPY;  Surgeon: Lyndle Herrlich, MD;  Location: [facility];  Service: Orthopedics;  Laterality: Right;  SIGMOIDOSCOPY N/A 10/01/2015   Procedure: SIGMOIDOSCOPY;  Surgeon: Earline Mayotte, MD;  Location: [facility];  Service: General;  Laterality: N/A;   SUBACROMIAL DECOMPRESSION Right 06/21/2017   Procedure: SUBACROMIAL DECOMPRESSION;  Surgeon: Lyndle Herrlich, MD;  Location: [facility];  Service: Orthopedics;  Laterality: Right;    Prior to Admission medications   Medication Sig Start Date End Date Taking? Authorizing Provider   Carboxymeth-Glycerin-Polysorb (REFRESH OPTIVE ADVANCED) 0.5-1-0.5 % SOLN Place 1 drop into both eyes 2 (two) times daily as needed (for dry eyes).     [provider]  Cholecalciferol (VITAMIN D3 PO) Take by mouth daily.    [provider]  CORLANOR 5 MG TABS tablet Take 10 mg by mouth once. 08/03/22   [provider]  ezetimibe (ZETIA) 10 MG tablet Take 1 tablet (10 mg total) by mouth daily. 08/03/22 11/01/22  Debbe Odea, MD  ibuprofen (ADVIL,MOTRIN) 200 MG tablet Take 200 mg by mouth every 6 (six) hours as needed for headache or moderate pain.    [provider]  levothyroxine (SYNTHROID) 150 MCG tablet Take 150 mcg by mouth daily. 08/21/22   [provider]  metoprolol tartrate (LOPRESSOR) 100 MG tablet Take 1 tablet (100 mg total) by mouth once for 1 dose. TWO HOURS PRIOR TO CARDIAC CT 08/03/22 08/03/22  Debbe Odea, MD  Multiple Vitamins-Minerals (CENTRUM ADULTS PO) Take 1 tablet by mouth daily.     [provider]  nystatin-triamcinolone ointment (MYCOLOG) Apply 1 application topically 3 (three) times daily. 01/13/18   Earline Mayotte, MD  ondansetron (ZOFRAN-ODT) 4 MG disintegrating tablet Take 4 mg by mouth 2 (two) times daily as needed. Patient not taking: Reported on 08/03/2022 06/26/21   [provider]  simvastatin (ZOCOR) 20 MG tablet Take 20 mg by mouth at bedtime.  Patient not taking: Reported on 08/03/2022 09/20/15   [provider]  tadalafil (CIALIS) 5 MG tablet Take 5 mg by mouth daily as needed for erectile dysfunction. Patient not taking: Reported on 08/03/2022    [provider]  vardenafil (LEVITRA) 10 MG tablet Take 10 mg by mouth as needed. 05/05/21   [provider]    Family History  Problem Relation Age of Onset   Cancer Mother        skin   Cancer Father        Prostate   Heart attack Father      Social History   Tobacco Use   Smoking status: Every Day     Packs/day: 1.00    Years: 30.00    Additional pack years: 0.00    Total pack years: 30.00    Types: Cigarettes   Smokeless tobacco: Never  Vaping Use   Vaping Use: Never used  Substance Use Topics   Alcohol use: No    Alcohol/week: 0.0 standard drinks of alcohol    Comment: SOBER FOR 25 YEARS   Drug use: No    Comment: H/O COCAINE 25 YEARS AGO    Allergies as of 08/27/2022 - Review Complete 08/27/2022  Allergen Reaction Noted   Statins  08/03/2022    Review of Systems:    All systems reviewed and negative except where noted in HPI.   Physical Exam:  BP 123/79   Pulse 74   Temp 97.9 F (36.6 C) (Oral)   Ht 5\' 8"  (1.727 m)   Wt 186 lb 12.8 oz (84.7 kg)   BMI 28.40 kg/m  No LMP for male patient. Psych:  Alert and cooperative. Normal mood and affect. General:   Alert,  Well-developed, well-nourished, pleasant and cooperative in NAD Head:  Normocephalic and atraumatic. Eyes:  Sclera clear, no icterus.   Conjunctiva pink. Ears:  Normal auditory acuity. Rectal exam chaperone present in the room: External large skin tags noted no other abnormalities with rectal exam Neurologic:  Alert and oriented x3;  grossly normal neurologically. Psych:  Alert and cooperative. Normal mood and affect.  Imaging Studies: No results found.  Assessment and Plan:   QUADARRIUS DOBIES is a 62 y.o. y/o male has been referred for hemorrhoids.  On examination has large skin tags.  Plan 1.  Colonoscopy to rule out hemorrhoids 2.  He has large external skin tags if those are an issue I discussed with him that they are mostly cosmetic and he can see a surgeon to have them taken out   I have discussed alternative options, risks & benefits,  which include, but are not limited to, bleeding, infection, perforation,respiratory complication & drug reaction.  The patient agrees with this plan & written consent will be obtained.     Follow up in as needed  Dr Wyline Mood MD,MRCP(U.K)

## 2022-09-01 ENCOUNTER — Ambulatory Visit: Payer: 59 | Admitting: Anesthesiology

## 2022-09-01 ENCOUNTER — Telehealth (HOSPITAL_COMMUNITY): Payer: Self-pay | Admitting: Emergency Medicine

## 2022-09-01 ENCOUNTER — Other Ambulatory Visit: Payer: Self-pay

## 2022-09-01 ENCOUNTER — Encounter
Admission: RE | Disposition: A | Discharge status: Home / Self Care | Payer: Self-pay | Source: Home / Self Care | Attending: Gastroenterology

## 2022-09-01 ENCOUNTER — Ambulatory Visit
Admission: RE | Admit: 2022-09-01 | Discharge: 2022-09-01 | Disposition: A | Discharge status: Home / Self Care | Payer: 59 | Attending: Gastroenterology | Admitting: Gastroenterology

## 2022-09-01 ENCOUNTER — Encounter: Payer: Self-pay | Admitting: Gastroenterology

## 2022-09-01 ENCOUNTER — Telehealth (HOSPITAL_COMMUNITY): Payer: Self-pay | Admitting: *Deleted

## 2022-09-01 DIAGNOSIS — D122 Benign neoplasm of ascending colon: Secondary | ICD-10-CM | POA: Diagnosis not present

## 2022-09-01 DIAGNOSIS — J449 Chronic obstructive pulmonary disease, unspecified: Secondary | ICD-10-CM | POA: Insufficient documentation

## 2022-09-01 DIAGNOSIS — K219 Gastro-esophageal reflux disease without esophagitis: Secondary | ICD-10-CM | POA: Diagnosis not present

## 2022-09-01 DIAGNOSIS — D126 Benign neoplasm of colon, unspecified: Secondary | ICD-10-CM | POA: Diagnosis not present

## 2022-09-01 DIAGNOSIS — F17200 Nicotine dependence, unspecified, uncomplicated: Secondary | ICD-10-CM | POA: Insufficient documentation

## 2022-09-01 DIAGNOSIS — Z79890 Hormone replacement therapy (postmenopausal): Secondary | ICD-10-CM | POA: Insufficient documentation

## 2022-09-01 DIAGNOSIS — Z1211 Encounter for screening for malignant neoplasm of colon: Secondary | ICD-10-CM

## 2022-09-01 DIAGNOSIS — K625 Hemorrhage of anus and rectum: Secondary | ICD-10-CM | POA: Diagnosis not present

## 2022-09-01 DIAGNOSIS — K640 First degree hemorrhoids: Secondary | ICD-10-CM | POA: Insufficient documentation

## 2022-09-01 DIAGNOSIS — E039 Hypothyroidism, unspecified: Secondary | ICD-10-CM | POA: Diagnosis not present

## 2022-09-01 DIAGNOSIS — E785 Hyperlipidemia, unspecified: Secondary | ICD-10-CM | POA: Insufficient documentation

## 2022-09-01 DIAGNOSIS — Z79899 Other long term (current) drug therapy: Secondary | ICD-10-CM | POA: Insufficient documentation

## 2022-09-01 DIAGNOSIS — F17210 Nicotine dependence, cigarettes, uncomplicated: Secondary | ICD-10-CM | POA: Diagnosis not present

## 2022-09-01 HISTORY — PX: COLONOSCOPY WITH PROPOFOL: SHX5780

## 2022-09-01 SURGERY — COLONOSCOPY WITH PROPOFOL
Anesthesia: General

## 2022-09-01 MED ORDER — LIDOCAINE HCL (CARDIAC) PF 100 MG/5ML IV SOSY
PREFILLED_SYRINGE | INTRAVENOUS | Status: DC | PRN
  Administered 2022-09-01: 50 mg via INTRAVENOUS

## 2022-09-01 MED ORDER — PROPOFOL 500 MG/50ML IV EMUL
INTRAVENOUS | Status: DC | PRN
  Administered 2022-09-01: 150 ug/kg/min via INTRAVENOUS

## 2022-09-01 MED ORDER — PROPOFOL 10 MG/ML IV BOLUS
INTRAVENOUS | Status: DC | PRN
  Administered 2022-09-01: 20 mg via INTRAVENOUS
  Administered 2022-09-01: 90 mg via INTRAVENOUS

## 2022-09-01 MED ORDER — SODIUM CHLORIDE 0.9 % IV SOLN: INTRAVENOUS | Status: DC

## 2022-09-01 NOTE — Anesthesia Preprocedure Evaluation (Signed)
Anesthesia Evaluation  Patient identified by MRN, date of birth, ID band Patient awake    Reviewed: Allergy & Precautions, NPO status , Patient's Chart, lab work & pertinent test results  History of Anesthesia Complications Negative for: history of anesthetic complications  Airway Mallampati: III  TM Distance: <3 FB Neck ROM: full    Dental  (+) Missing   Pulmonary neg shortness of breath, COPD, Current Smoker and Patient abstained from smoking.   Pulmonary exam normal        Cardiovascular Exercise Tolerance: Good (-) angina negative cardio ROS Normal cardiovascular exam     Neuro/Psych negative neurological ROS  negative psych ROS   GI/Hepatic ,GERD  Controlled,,(+) Hepatitis -  Endo/Other  Hypothyroidism    Renal/GU negative Renal ROS  negative genitourinary   Musculoskeletal   Abdominal   Peds  Hematology negative hematology ROS (+)   Anesthesia Other Findings Past Medical History: No date: GERD (gastroesophageal reflux disease)     Comment:  rare-no meds 2015: Glaucoma 1970'S: Hepatitis     Comment:  A OR B-PT CANNOT REMEMBER WHICH ONE No date: Hyperlipidemia No date: Hypothyroidism No date: Thyroid disease No date: Wears dentures     Comment:  full upper and lower  Past Surgical History: 01/03/2018: ANAL FISTULOTOMY; N/A     Comment:  Procedure: ANAL FISTULOTOMY;  Surgeon: Earline Mayotte, MD;  Location: [facility];  Service: General;                Laterality: N/A; 02/23/2018: ANAL FISTULOTOMY; N/A     Comment:  Procedure: ANAL FISTULOTOMY;  Surgeon: Earline Mayotte, MD;  Location: [facility];  Service: General;                Laterality: N/A; 06/21/2017: ARTHOSCOPIC ROTAOR CUFF REPAIR; Right     Comment:  Procedure: ARTHROSCOPIC ROTATOR CUFF REPAIR;  Surgeon:               Lyndle Herrlich, MD;  Location: [facility];  Service:               Orthopedics;  Laterality:  Right; 06/23/2018: EVALUATION UNDER ANESTHESIA WITH ANAL FISTULECTOMY; N/A     Comment:  Procedure: EXAM UNDER ANESTHESIA WITH ANAL FISTULECTOMY;              Surgeon: Earline Mayotte, MD;  Location: [facility];                Service: General;  Laterality: N/A; 2016: EYE SURGERY; Left     Comment:  CORNEAL SURGERY 10/01/2015: FISTULOTOMY; N/A     Comment:  Procedure: FISTULOTOMY;  Surgeon: Earline Mayotte, MD;              Location: [facility];  Service: General;  Laterality: N/A; 1990: HEMORRHOID SURGERY 10/01/2015: INCISION AND DRAINAGE PERIRECTAL ABSCESS; N/A     Comment:  Procedure: IRRIGATION AND DEBRIDEMENT PERIRECTAL               ABSCESS;  Surgeon: Earline Mayotte, MD;  Location: [facility];  Service: General;  Laterality: N/A; 06/06/2020: MASS EXCISION; Left     Comment:  Procedure: EXCISION TUMOR FOOT-SUBQ LEFT;  Surgeon:  Rosetta Posner, DPM;  Location: [facility];                Service: Podiatry;  Laterality: Left; 06/21/2017: RESECTION DISTAL CLAVICAL     Comment:  Procedure: DISTAL CLAVICALEXCISION;  Surgeon: Lyndle Herrlich, MD;  Location: [facility];  Service: Orthopedics;; 06/21/2017: SHOULDER ARTHROSCOPY WITH BICEPSTENOTOMY; Right     Comment:  Procedure: SHOULDER ARTHROSCOPY WITH BICEPSTENOTOMY;                Surgeon: Lyndle Herrlich, MD;  Location: [facility];                Service: Orthopedics;  Laterality: Right; 06/21/2017: SHOULDER ARTHROSCOPY WITH OPEN ROTATOR CUFF REPAIR; Right     Comment:  Procedure: SHOULDER ARTHROSCOPY;  Surgeon: Lyndle Herrlich, MD;  Location: [facility];  Service: Orthopedics;                Laterality: Right; 10/01/2015: SIGMOIDOSCOPY; N/A     Comment:  Procedure: SIGMOIDOSCOPY;  Surgeon: Earline Mayotte,               MD;  Location: [facility];  Service: General;  Laterality:               N/A; 06/21/2017: SUBACROMIAL DECOMPRESSION; Right     Comment:  Procedure: SUBACROMIAL  DECOMPRESSION;  Surgeon: Lyndle Herrlich, MD;  Location: [facility];  Service: Orthopedics;               Laterality: Right;  BMI    Body Mass Index: 27.37 kg/m      Reproductive/Obstetrics negative OB ROS                             Anesthesia Physical Anesthesia Plan  ASA: 3  Anesthesia Plan: General   Post-op Pain Management:    Induction: Intravenous  PONV Risk Score and Plan: Propofol infusion and TIVA  Airway Management Planned: Natural Airway and Nasal Cannula  Additional Equipment:   Intra-op Plan:   Post-operative Plan:   Informed Consent: I have reviewed the patients History and Physical, chart, labs and discussed the procedure including the risks, benefits and alternatives for the proposed anesthesia with the patient or authorized representative who has indicated his/her understanding and acceptance.     Dental Advisory Given  Plan Discussed with: Anesthesiologist, CRNA and Surgeon  Anesthesia Plan Comments: (Patient consented for risks of anesthesia including but not limited to:  - adverse reactions to medications - risk of airway placement if required - damage to eyes, teeth, lips or other oral mucosa - nerve damage due to positioning  - sore throat or hoarseness - Damage to heart, brain, nerves, lungs, other parts of body or loss of life  Patient voiced understanding.)       Anesthesia Quick Evaluation

## 2022-09-01 NOTE — Op Note (Signed)
[facility] Gastroenterology Patient Name: William Ochoa Procedure Date: 09/01/2022 11:07 AM MRN: [id number] Account #: [id number] Date of Birth: 1961/02/06 Admit Type: Outpatient Age: 62 Room: [facility] ENDO ROOM 2 Gender: Male Note Status: Finalized Instrument Name: Nelda Marseille [id number] Procedure:             Colonoscopy Indications:           Rectal bleeding Providers:             Wyline Mood MD, MD Referring MD:          Azucena Cecil, Brain Medicines:             Monitored Anesthesia Care Complications:         No immediate complications. Procedure:             Pre-Anesthesia Assessment:                        - Prior to the procedure, a History and Physical was                         performed, and patient medications, allergies and                         sensitivities were reviewed. The patient's tolerance                         of previous anesthesia was reviewed.                        - The risks and benefits of the procedure and the                         sedation options and risks were discussed with the                         patient. All questions were answered and informed                         consent was obtained.                        - ASA Grade Assessment: II - A patient with mild                         systemic disease.                        After obtaining informed consent, the colonoscope was                         passed under direct vision. Throughout the procedure,                         the patient's blood pressure, pulse, and oxygen                         saturations were monitored continuously. The                         Colonoscope was introduced through the anus and  advanced to the the cecum, identified by the                         appendiceal orifice. The colonoscopy was performed                         with ease. The patient tolerated the procedure well.                         The quality of the  bowel preparation was good. The                         ileocecal valve, appendiceal orifice, and rectum were                         photographed. Findings:      The perianal and digital rectal examinations were normal.      A 12 mm polyp was found in the ascending colon. The polyp was sessile.       The polyp was removed with a cold snare. Resection and retrieval were       complete. To prevent bleeding after the polypectomy, two hemostatic       clips were successfully placed. There was no bleeding during, or at the       end, of the procedure.      Non-bleeding internal hemorrhoids were found during retroflexion. The       hemorrhoids were medium-sized and Grade I (internal hemorrhoids that do       not prolapse).      The exam was otherwise without abnormality on direct and retroflexion       views. Impression:            - One 12 mm polyp in the ascending colon, removed with                         a cold snare. Resected and retrieved. Clips were                         placed.                        - Non-bleeding internal hemorrhoids.                        - The examination was otherwise normal on direct and                         retroflexion views. Recommendation:        - Discharge patient to home (with escort).                        - Resume previous diet.                        - Continue present medications.                        - Await pathology results.                        - Repeat colonoscopy  in 3 years for surveillance based                         on pathology results. Procedure Code(s):     --- Professional ---                        [phone], Colonoscopy, flexible; with removal of                         tumor(s), polyp(s), or other lesion(s) by snare                         technique Diagnosis Code(s):     --- Professional ---                        D12.2, Benign neoplasm of ascending colon                        K64.0, First degree hemorrhoids                         K62.5, Hemorrhage of anus and rectum CPT copyright 2022 American [employer] Association. All rights reserved. The codes documented in this report are preliminary and upon coder review may  be revised to meet current compliance requirements. Wyline Mood, MD Wyline Mood MD, MD 09/01/2022 11:41:31 AM This report has been signed electronically. Number of Addenda: 0 Note Initiated On: 09/01/2022 11:07 AM Scope Withdrawal Time: 0 hours 15 minutes 55 seconds  Total Procedure Duration: 0 hours 20 minutes 39 seconds  Estimated Blood Loss:  Estimated blood loss: none.      [employer]

## 2022-09-01 NOTE — Telephone Encounter (Signed)
Patient returning call about his upcoming cardiac imaging study; pt verbalizes understanding of appt date/time, parking situation and where to check in, pre-test NPO status and medications ordered, and verified current allergies; name and call back number provided for further questions should they arise  Larey Brick RN Navigator Cardiac Imaging [facility] [phone] office [phone] cell  Patient to take 100mg  metoprolol tartrate two hours prior to his cardiac CT scan.  He is aware to arrive at 12pm.

## 2022-09-01 NOTE — Anesthesia Postprocedure Evaluation (Signed)
Anesthesia Post Note  Patient: William Ochoa  Procedure(s) Performed: COLONOSCOPY WITH PROPOFOL  Patient location during evaluation: Endoscopy Anesthesia Type: General Level of consciousness: awake and alert Pain management: pain level controlled Vital Signs Assessment: post-procedure vital signs reviewed and stable Respiratory status: spontaneous breathing, nonlabored ventilation, respiratory function stable and patient connected to nasal cannula oxygen Cardiovascular status: blood pressure returned to baseline and stable Postop Assessment: no apparent nausea or vomiting Anesthetic complications: no   No notable events documented.   Last Vitals:  Vitals:   09/01/22 1151 09/01/22 1201  BP: 103/68 131/85  Pulse: 71 62  Resp: 20 (!) 22  Temp:    SpO2: 96% 96%    Last Pain:  Vitals:   09/01/22 1201  TempSrc:   PainSc: 0-No pain                 Cleda Mccreedy [place] Glaus

## 2022-09-01 NOTE — Telephone Encounter (Signed)
Attempted to call patient regarding upcoming cardiac CT appointment. °Left message on voicemail with name and callback number °Rafia Shedden RN Navigator Cardiac Imaging °[facility] °[phone] Office °[phone] Cell ° °

## 2022-09-01 NOTE — Anesthesia Procedure Notes (Signed)
Procedure Name: MAC Date/Time: 09/01/2022 11:13 AM  Performed by: Cheral Bay, CRNAPre-anesthesia Checklist: Patient identified, Emergency Drugs available, Suction available, Patient being monitored and Timeout performed Patient Re-evaluated:Patient Re-evaluated prior to induction Oxygen Delivery Method: Nasal cannula Induction Type: IV induction Placement Confirmation: positive ETCO2 and CO2 detector

## 2022-09-01 NOTE — Transfer of Care (Signed)
Immediate Anesthesia Transfer of Care Note  Patient: William Ochoa  Procedure(s) Performed: COLONOSCOPY WITH PROPOFOL  Patient Location: PACU and Endoscopy Unit  Anesthesia Type:General  Level of Consciousness: sedated  Airway & Oxygen Therapy: Patient Spontanous Breathing  Post-op Assessment: Report given to RN and Post -op Vital signs reviewed and stable  Post vital signs: Reviewed and stable  Last Vitals:  Vitals Value Taken Time  BP 102/77 09/01/22 1141  Temp    Pulse 76 09/01/22 1141  Resp 16 09/01/22 1141  SpO2 93 % 09/01/22 1141  Vitals shown include unvalidated device data.  Last Pain:  Vitals:   09/01/22 1039  TempSrc: Temporal  PainSc: 0-No pain         Complications: No notable events documented.

## 2022-09-01 NOTE — H&P (Signed)
Wyline Mood, MD [address], [address], [place], [place], [address] [address], [place], [place], [address] Phone: [phone]  Fax: [phone]  Primary Care Physician:  Gildardo Pounds, PA   Pre-Procedure History & Physical: HPI:  William Ochoa is a 62 y.o. male is here for an colonoscopy.   Past Medical History:  Diagnosis Date   GERD (gastroesophageal reflux disease)    rare-no meds   Glaucoma 2015   Hepatitis 1970'S   A OR B-PT CANNOT REMEMBER WHICH ONE   Hyperlipidemia    Hypothyroidism    Thyroid disease    Wears dentures    full upper and lower    Past Surgical History:  Procedure Laterality Date   ANAL FISTULOTOMY N/A 01/03/2018   Procedure: ANAL FISTULOTOMY;  Surgeon: Earline Mayotte, MD;  Location: [facility];  Service: General;  Laterality: N/A;   ANAL FISTULOTOMY N/A 02/23/2018   Procedure: ANAL FISTULOTOMY;  Surgeon: Earline Mayotte, MD;  Location: [facility];  Service: General;  Laterality: N/A;   ARTHOSCOPIC ROTAOR CUFF REPAIR Right 06/21/2017   Procedure: ARTHROSCOPIC ROTATOR CUFF REPAIR;  Surgeon: Lyndle Herrlich, MD;  Location: [facility];  Service: Orthopedics;  Laterality: Right;   EVALUATION UNDER ANESTHESIA WITH ANAL FISTULECTOMY N/A 06/23/2018   Procedure: EXAM UNDER ANESTHESIA WITH ANAL FISTULECTOMY;  Surgeon: Earline Mayotte, MD;  Location: [facility];  Service: General;  Laterality: N/A;   EYE SURGERY Left 2016   CORNEAL SURGERY   FISTULOTOMY N/A 10/01/2015   Procedure: FISTULOTOMY;  Surgeon: Earline Mayotte, MD;  Location: [facility];  Service: General;  Laterality: N/A;   HEMORRHOID SURGERY  1990   INCISION AND DRAINAGE PERIRECTAL ABSCESS N/A 10/01/2015   Procedure: IRRIGATION AND DEBRIDEMENT PERIRECTAL ABSCESS;  Surgeon: Earline Mayotte, MD;  Location: [facility];  Service: General;  Laterality: N/A;   MASS EXCISION Left 06/06/2020   Procedure: EXCISION TUMOR FOOT-SUBQ LEFT;  Surgeon: Rosetta Posner, DPM;  Location: [facility];  Service: Podiatry;  Laterality: Left;   RESECTION DISTAL CLAVICAL  06/21/2017   Procedure: DISTAL CLAVICALEXCISION;  Surgeon: Lyndle Herrlich, MD;  Location: [facility];  Service: Orthopedics;;   SHOULDER ARTHROSCOPY WITH BICEPSTENOTOMY Right 06/21/2017   Procedure: SHOULDER ARTHROSCOPY WITH BICEPSTENOTOMY;  Surgeon: Lyndle Herrlich, MD;  Location: [facility];  Service: Orthopedics;  Laterality: Right;   SHOULDER ARTHROSCOPY WITH OPEN ROTATOR CUFF REPAIR Right 06/21/2017   Procedure: SHOULDER ARTHROSCOPY;  Surgeon: Lyndle Herrlich, MD;  Location: [facility];  Service: Orthopedics;  Laterality: Right;   SIGMOIDOSCOPY N/A 10/01/2015   Procedure: SIGMOIDOSCOPY;  Surgeon: Earline Mayotte, MD;  Location: [facility];  Service: General;  Laterality: N/A;   SUBACROMIAL DECOMPRESSION Right 06/21/2017   Procedure: SUBACROMIAL DECOMPRESSION;  Surgeon: Lyndle Herrlich, MD;  Location: [facility];  Service: Orthopedics;  Laterality: Right;    Prior to Admission medications   Medication Sig Start Date End Date Taking? Authorizing Provider  Cholecalciferol (VITAMIN D3 PO) Take by mouth daily.   Yes [provider]  CORLANOR 5 MG TABS tablet Take 10 mg by mouth once. 08/03/22  Yes [provider]  levothyroxine (SYNTHROID) 150 MCG tablet Take 150 mcg by mouth daily. 08/21/22  Yes [provider]  Multiple Vitamins-Minerals (CENTRUM ADULTS PO) Take 1 tablet by mouth daily.    Yes [provider]  Carboxymeth-Glycerin-Polysorb (REFRESH OPTIVE ADVANCED) 0.5-1-0.5 % SOLN Place 1 drop into both eyes 2 (two) times daily as needed (  for dry eyes).     [provider]  ezetimibe (ZETIA) 10 MG tablet Take 1 tablet (10 mg total) by mouth daily. Patient not taking: Reported on 09/01/2022 08/03/22 11/01/22  Debbe Odea, MD  ibuprofen (ADVIL,MOTRIN) 200 MG tablet Take 200 mg by mouth every 6 (six) hours as needed for headache or moderate pain.    [provider]   metoprolol tartrate (LOPRESSOR) 100 MG tablet Take 1 tablet (100 mg total) by mouth once for 1 dose. TWO HOURS PRIOR TO CARDIAC CT 08/03/22 08/27/22  Debbe Odea, MD  nystatin-triamcinolone ointment [facility]) Apply 1 application topically 3 (three) times daily. 01/13/18   Earline Mayotte, MD  ondansetron (ZOFRAN-ODT) 4 MG disintegrating tablet Take 4 mg by mouth 2 (two) times daily as needed. 06/26/21   [provider]  simvastatin (ZOCOR) 20 MG tablet Take 20 mg by mouth at bedtime. Patient not taking: Reported on 09/01/2022 09/20/15   [provider]  tadalafil (CIALIS) 5 MG tablet Take 5 mg by mouth daily as needed for erectile dysfunction.    [provider]  vardenafil (LEVITRA) 10 MG tablet Take 10 mg by mouth as needed. 05/05/21   [provider]    Allergies as of 08/28/2022 - Review Complete 08/27/2022  Allergen Reaction Noted   Statins  08/03/2022    Family History  Problem Relation Age of Onset   Cancer Mother        skin   Cancer Father        Prostate   Heart attack Father     Social History   Socioeconomic History   Marital status: Married    Spouse name: Not on file   Number of children: Not on file   Years of education: Not on file   Highest education level: Not on file  Occupational History   Not on file  Tobacco Use   Smoking status: Every Day    Packs/day: 1.00    Years: 30.00    Additional pack years: 0.00    Total pack years: 30.00    Types: Cigarettes   Smokeless tobacco: Never  Vaping Use   Vaping Use: Never used  Substance and Sexual Activity   Alcohol use: No    Comment: SOBER FOR 35 YEARS   Drug use: No    Comment: 3   Sexual activity: Not on file  Other Topics Concern   Not on file  Social History Narrative   Not on file   Social Determinants of Health   Financial Resource Strain: Not on file  Food Insecurity: Not on file  Transportation Needs: Not on file  Physical Activity: Not on file   Stress: Not on file  Social Connections: Not on file  Intimate Partner Violence: Not on file    Review of Systems: See HPI, otherwise negative ROS  Physical Exam: BP 125/89   Pulse 80   Temp (!) 97.1 F (36.2 C) (Temporal)   Resp 20   Ht 5\' 8"  (1.727 m)   Wt 81.6 kg   SpO2 100%   BMI 27.37 kg/m  General:   Alert,  pleasant and cooperative in NAD Head:  Normocephalic and atraumatic. Neck:  Supple; no masses or thyromegaly. Lungs:  Clear throughout to auscultation, normal respiratory effort.    Heart:  +S1, +S2, Regular rate and rhythm, No edema. Abdomen:  Soft, nontender and nondistended. Normal bowel sounds, without guarding, and without rebound.   Neurologic:  Alert and  oriented x4;  grossly normal neurologically.  Impression/Plan: William Ochoa is here for an colonoscopy to be performed for rectal bleeding. Risks, benefits, limitations, and alternatives regarding  colonoscopy have been reviewed with the patient.  Questions have been answered.  All parties agreeable.   Wyline Mood, MD  09/01/2022, 11:13 AM

## 2022-09-02 ENCOUNTER — Encounter: Payer: Self-pay | Admitting: Gastroenterology

## 2022-09-02 ENCOUNTER — Ambulatory Visit (INDEPENDENT_AMBULATORY_CARE_PROVIDER_SITE_OTHER): Payer: 59

## 2022-09-02 ENCOUNTER — Ambulatory Visit (HOSPITAL_COMMUNITY)
Admission: RE | Admit: 2022-09-02 | Discharge: 2022-09-02 | Disposition: A | Discharge status: Home / Self Care | Payer: 59 | Source: Ambulatory Visit | Attending: Cardiology | Admitting: Cardiology

## 2022-09-02 DIAGNOSIS — R9431 Abnormal electrocardiogram [ECG] [EKG]: Secondary | ICD-10-CM

## 2022-09-02 DIAGNOSIS — R072 Precordial pain: Secondary | ICD-10-CM | POA: Insufficient documentation

## 2022-09-02 DIAGNOSIS — R079 Chest pain, unspecified: Secondary | ICD-10-CM

## 2022-09-02 LAB — ECHOCARDIOGRAM COMPLETE
AR max vel: 4.09 cm2
AV Area VTI: 4.02 cm2
AV Area mean vel: 3.96 cm2
AV Mean grad: 2 mmHg
AV Peak grad: 3.6 mmHg
Ao pk vel: 0.95 m/s
Area-P 1/2: 2.95 cm2
Calc EF: 46.4 %
S' Lateral: 3.3 cm
Single Plane A2C EF: 49.2 %
Single Plane A4C EF: 43.8 %

## 2022-09-02 MED ORDER — NITROGLYCERIN 0.4 MG SL SUBL
SUBLINGUAL_TABLET | SUBLINGUAL | Status: AC
  Filled 2022-09-02: qty 2

## 2022-09-02 MED ORDER — IOHEXOL 350 MG/ML SOLN
95.0000 mL | Freq: Once | INTRAVENOUS | Status: AC | PRN
  Administered 2022-09-02: 95 mL via INTRAVENOUS

## 2022-09-02 MED ORDER — NITROGLYCERIN 0.4 MG SL SUBL
0.8000 mg | SUBLINGUAL_TABLET | Freq: Once | SUBLINGUAL | Status: AC
  Administered 2022-09-02: 0.8 mg via SUBLINGUAL

## 2022-09-04 LAB — SURGICAL PATHOLOGY

## 2022-09-07 ENCOUNTER — Encounter: Payer: Self-pay | Admitting: Gastroenterology

## 2022-10-05 ENCOUNTER — Encounter: Payer: Self-pay | Admitting: Cardiology

## 2022-10-05 ENCOUNTER — Ambulatory Visit: Payer: 59 | Attending: Cardiology | Admitting: Cardiology

## 2022-10-05 VITALS — BP 130/76 | HR 78 | Ht 68.0 in | Wt 186.0 lb

## 2022-10-05 DIAGNOSIS — R911 Solitary pulmonary nodule: Secondary | ICD-10-CM | POA: Diagnosis not present

## 2022-10-05 DIAGNOSIS — F17200 Nicotine dependence, unspecified, uncomplicated: Secondary | ICD-10-CM

## 2022-10-05 DIAGNOSIS — R072 Precordial pain: Secondary | ICD-10-CM

## 2022-10-05 DIAGNOSIS — E7800 Pure hypercholesterolemia, unspecified: Secondary | ICD-10-CM | POA: Diagnosis not present

## 2022-10-05 MED ORDER — EZETIMIBE 10 MG PO TABS: 10.0000 mg | ORAL_TABLET | Freq: Every day | ORAL | 3 refills | Status: DC

## 2022-10-05 NOTE — Patient Instructions (Addendum)
Medication Instructions:  Please take your ZETIA daily.   *If you need a refill on your cardiac medications before your next appointment, please call your pharmacy*   Lab Work: LIPID in 3 months (lab order is placed)  If you have labs (blood work) drawn today and your tests are completely normal, you will receive your results only by: MyChart Message (if you have MyChart) OR A paper copy in the mail If you have any lab test that is abnormal or we need to change your treatment, we will call you to review the results.   Follow-Up: At [facility], you and your health needs are our priority.  As part of our continuing mission to provide you with exceptional heart care, we have created designated Provider Care Teams.  These Care Teams include your primary Cardiologist (physician) and Advanced Practice Providers (APPs -  Physician Assistants and Nurse Practitioners) who all work together to provide you with the care you need, when you need it.  We recommend signing up for the patient portal called "MyChart".  Sign up information is provided on this After Visit Summary.  MyChart is used to connect with patients for Virtual Visits (Telemedicine).  Patients are able to view lab/test results, encounter notes, upcoming appointments, etc.  Non-urgent messages can be sent to your provider as well.   To learn more about what you can do with MyChart, go to [email or web address].    Your next appointment:   6 month(s)  Provider:   Debbe Odea, MD    Referral to Pulmonary- they will call you to set up an appointment.

## 2022-10-05 NOTE — Progress Notes (Signed)
Cardiology Office Note:    Date:  10/05/2022   ID:  William Ochoa, DOB [birth year], MRN [id number]  PCP:  William Pounds, PA   [facility] Providers Cardiologist:  William Odea, MD     Referring MD: William Pounds, PA   Chief Complaint  Patient presents with   Follow-up    Review cardiac testing.  Patient denies new or acute cardiac problems/concerns today.      History of Present Illness:    William Ochoa is a 62 y.o. male with a hx of hyperlipidemia, hypothyroidism, current smoker x 40+ years who presents for follow-up.  Previously seen due to chest pressure.  Echo and coronary CTA obtained to evaluate for any cardiac etiology.  He still smokes.  Chest discomfort about the same.  Presents for cardiac testing results.  Chest pain not associated with exertion.  Zetia previously started due to statin intolerance.  Endorse taking Zetia daily as prescribed.   Past Medical History:  Diagnosis Date   GERD (gastroesophageal reflux disease)    rare-no meds   Glaucoma 2015   Hepatitis 1970'S   A OR B-PT CANNOT REMEMBER WHICH ONE   Hyperlipidemia    Hypothyroidism    Thyroid disease    Wears dentures    full upper and lower    Past Surgical History:  Procedure Laterality Date   ANAL FISTULOTOMY N/A 01/03/2018   Procedure: ANAL FISTULOTOMY;  Surgeon: Earline Mayotte, MD;  Location: [facility];  Service: General;  Laterality: N/A;   ANAL FISTULOTOMY N/A 02/23/2018   Procedure: ANAL FISTULOTOMY;  Surgeon: Earline Mayotte, MD;  Location: [facility];  Service: General;  Laterality: N/A;   ARTHOSCOPIC ROTAOR CUFF REPAIR Right 06/21/2017   Procedure: ARTHROSCOPIC ROTATOR CUFF REPAIR;  Surgeon: Lyndle Herrlich, MD;  Location: [facility];  Service: Orthopedics;  Laterality: Right;   COLONOSCOPY WITH PROPOFOL N/A 09/01/2022   Procedure: COLONOSCOPY WITH PROPOFOL;  Surgeon: Wyline Mood, MD;  Location: [facility];  Service: Gastroenterology;  Laterality: N/A;    EVALUATION UNDER ANESTHESIA WITH ANAL FISTULECTOMY N/A 06/23/2018   Procedure: EXAM UNDER ANESTHESIA WITH ANAL FISTULECTOMY;  Surgeon: Earline Mayotte, MD;  Location: [facility];  Service: General;  Laterality: N/A;   EYE SURGERY Left 2016   CORNEAL SURGERY   FISTULOTOMY N/A 10/01/2015   Procedure: FISTULOTOMY;  Surgeon: Earline Mayotte, MD;  Location: [facility];  Service: General;  Laterality: N/A;   HEMORRHOID SURGERY  1990   INCISION AND DRAINAGE PERIRECTAL ABSCESS N/A 10/01/2015   Procedure: IRRIGATION AND DEBRIDEMENT PERIRECTAL ABSCESS;  Surgeon: Earline Mayotte, MD;  Location: [facility];  Service: General;  Laterality: N/A;   MASS EXCISION Left 06/06/2020   Procedure: EXCISION TUMOR FOOT-SUBQ LEFT;  Surgeon: Rosetta Posner, DPM;  Location: [facility];  Service: Podiatry;  Laterality: Left;   RESECTION DISTAL CLAVICAL  06/21/2017   Procedure: DISTAL CLAVICALEXCISION;  Surgeon: Lyndle Herrlich, MD;  Location: [facility];  Service: Orthopedics;;   SHOULDER ARTHROSCOPY WITH BICEPSTENOTOMY Right 06/21/2017   Procedure: SHOULDER ARTHROSCOPY WITH BICEPSTENOTOMY;  Surgeon: Lyndle Herrlich, MD;  Location: [facility];  Service: Orthopedics;  Laterality: Right;   SHOULDER ARTHROSCOPY WITH OPEN ROTATOR CUFF REPAIR Right 06/21/2017   Procedure: SHOULDER ARTHROSCOPY;  Surgeon: Lyndle Herrlich, MD;  Location: [facility];  Service: Orthopedics;  Laterality: Right;   SIGMOIDOSCOPY N/A 10/01/2015   Procedure: SIGMOIDOSCOPY;  Surgeon: Earline Mayotte, MD;  Location: [facility];  Service: General;  Laterality:  N/A;   SUBACROMIAL DECOMPRESSION Right 06/21/2017   Procedure: SUBACROMIAL DECOMPRESSION;  Surgeon: Lyndle Herrlich, MD;  Location: [facility];  Service: Orthopedics;  Laterality: Right;    Current Medications: Current Meds  Medication Sig   Carboxymeth-Glycerin-Polysorb (REFRESH OPTIVE ADVANCED) 0.5-1-0.5 % SOLN Place 1 drop into both eyes 2 (two) times daily as needed (for dry eyes).    CORLANOR 5 MG  TABS tablet Take 5 mg by mouth once.   ibuprofen (ADVIL,MOTRIN) 200 MG tablet Take 200 mg by mouth every 6 (six) hours as needed for headache or moderate pain.   levothyroxine (SYNTHROID) 150 MCG tablet Take 150 mcg by mouth daily.   Multiple Vitamins-Minerals (CENTRUM ADULTS PO) Take 1 tablet by mouth daily.    nystatin-triamcinolone ointment (MYCOLOG) Apply 1 application topically 3 (three) times daily.   tadalafil (CIALIS) 5 MG tablet Take 5 mg by mouth daily as needed for erectile dysfunction.   vardenafil (LEVITRA) 10 MG tablet Take 10 mg by mouth as needed.     Allergies:   Statins   Social History   Socioeconomic History   Marital status: Married    Spouse name: Not on file   Number of children: Not on file   Years of education: Not on file   Highest education level: Not on file  Occupational History   Not on file  Tobacco Use   Smoking status: Every Day    Packs/day: 1.00    Years: 30.00    Additional pack years: 0.00    Total pack years: 30.00    Types: Cigarettes   Smokeless tobacco: Never  Vaping Use   Vaping Use: Never used  Substance and Sexual Activity   Alcohol use: No    Comment: SOBER FOR 35 YEARS   Drug use: No    Comment: 3   Sexual activity: Not on file  Other Topics Concern   Not on file  Social History Narrative   Not on file   Social Determinants of Health   Financial Resource Strain: Not on file  Food Insecurity: Not on file  Transportation Needs: Not on file  Physical Activity: Not on file  Stress: Not on file  Social Connections: Not on file     Family History: The patient's family history includes Cancer in his father and mother; Heart attack in his father.  ROS:   Please see the history of present illness.     All other systems reviewed and are negative.  EKGs/Labs/Other Studies Reviewed:    The following studies were reviewed today:   EKG:  EKG not  ordered today.    Recent Labs: 08/03/2022: BUN 16; Creatinine, Ser 0.81;  Potassium 4.2; Sodium 139  Recent Lipid Panel No results found for: "CHOL", "TRIG", "HDL", "CHOLHDL", "VLDL", "LDLCALC", "LDLDIRECT"   Risk Assessment/Calculations:             Physical Exam:    VS:  BP 130/76 (BP Location: Left Arm, Patient Position: Sitting, Cuff Size: Normal)   Pulse 78   Ht 5\' 8"  (1.727 m)   Wt 186 lb (84.4 kg)   SpO2 94%   BMI 28.28 kg/m     Wt Readings from Last 3 Encounters:  10/05/22 186 lb (84.4 kg)  09/01/22 180 lb (81.6 kg)  08/27/22 186 lb 12.8 oz (84.7 kg)     GEN:  Well nourished, well developed in no acute distress HEENT: Normal NECK: No JVD; No carotid bruits CARDIAC: RRR, no murmurs, rubs, gallops RESPIRATORY:  Clear to auscultation without rales, wheezing or rhonchi  ABDOMEN: Soft, non-tender, non-distended MUSCULOSKELETAL:  No edema; No deformity  SKIN: Warm and dry NEUROLOGIC:  Alert and oriented x 3 PSYCHIATRIC:  Normal affect   ASSESSMENT:    1. Precordial pain   2. Pure hypercholesterolemia   3. Smoking   4. Pulmonary nodule     PLAN:    In order of problems listed above:  Chest pressure, coronary CT with calcium score 0, no CAD.  Echo with low normal EF 45 to 50%.  Refer to pulmonary medicine to evaluate pulmonary etiology due to 40+ year smoking history. Hyperlipidemia, statin intolerance.  Continue Zetia.  Lipid panel in 3 months. Current smoker, smoking cessation advised.  Nicotine patch recommended, patient declined. Pulmonary nodule, smoker x 40+ years.  Refer to pulmonary medicine as above.  Follow-up in 6 months.     Medication Adjustments/Labs and Tests Ordered: Current medicines are reviewed at length with the patient today.  Concerns regarding medicines are outlined above.  Orders Placed This Encounter  Procedures   Lipid panel   Ambulatory referral to Pulmonology   Meds ordered this encounter  Medications   ezetimibe (ZETIA) 10 MG tablet    Sig: Take 1 tablet (10 mg total) by mouth daily.     Dispense:  90 tablet    Refill:  3    Patient Instructions  Medication Instructions:  Please take your ZETIA daily.   *If you need a refill on your cardiac medications before your next appointment, please call your pharmacy*   Lab Work: LIPID in 3 months (lab order is placed)  If you have labs (blood work) drawn today and your tests are completely normal, you will receive your results only by: MyChart Message (if you have MyChart) OR A paper copy in the mail If you have any lab test that is abnormal or we need to change your treatment, we will call you to review the results.   Follow-Up: At [facility], you and your health needs are our priority.  As part of our continuing mission to provide you with exceptional heart care, we have created designated Provider Care Teams.  These Care Teams include your primary Cardiologist (physician) and Advanced Practice Providers (APPs -  Physician Assistants and Nurse Practitioners) who all work together to provide you with the care you need, when you need it.  We recommend signing up for the patient portal called "MyChart".  Sign up information is provided on this After Visit Summary.  MyChart is used to connect with patients for Virtual Visits (Telemedicine).  Patients are able to view lab/test results, encounter notes, upcoming appointments, etc.  Non-urgent messages can be sent to your provider as well.   To learn more about what you can do with MyChart, go to [email or web address].    Your next appointment:   6 month(s)  Provider:   Debbe Odea, MD    Referral to Pulmonary- they will call you to set up an appointment.    Signed, William Odea, MD  10/05/2022 10:59 AM    [facility]

## 2022-10-06 ENCOUNTER — Telehealth: Payer: Self-pay

## 2022-10-06 ENCOUNTER — Telehealth: Payer: Self-pay | Admitting: Gastroenterology

## 2022-10-06 DIAGNOSIS — K648 Other hemorrhoids: Secondary | ICD-10-CM

## 2022-10-06 NOTE — Telephone Encounter (Signed)
Patient's PCP-Mrs. Whitten, PA called stating that she would want the patient to be seen by a surgeon because she stated that the patient is still having issues with his hemorrhoid (skin tag). Therefore, I let her know that we will send the referral to the general surgeon so they could take a look at it and see if they could do something about it. Mrs. Whitten, PA agreed and had no further questions.

## 2022-10-06 NOTE — Telephone Encounter (Signed)
Patient PCP called in about a skin tag that Dr. Tobi Bastos seen, and he stated that the patient needs a surgeon. His PCP wanted to know if he knows a referral if so, she is wondering how she go by doing that. Do you have any recommendation where he needs to go.

## 2022-10-12 ENCOUNTER — Ambulatory Visit: Payer: 59 | Admitting: Surgery

## 2022-10-12 ENCOUNTER — Encounter: Payer: Self-pay | Admitting: Surgery

## 2022-10-12 VITALS — BP 135/86 | HR 77 | Temp 98.5°F | Ht 68.0 in | Wt 184.8 lb

## 2022-10-12 DIAGNOSIS — K602 Anal fissure, unspecified: Secondary | ICD-10-CM

## 2022-10-12 DIAGNOSIS — K649 Unspecified hemorrhoids: Secondary | ICD-10-CM

## 2022-10-12 DIAGNOSIS — K645 Perianal venous thrombosis: Secondary | ICD-10-CM

## 2022-10-12 DIAGNOSIS — K648 Other hemorrhoids: Secondary | ICD-10-CM | POA: Diagnosis not present

## 2022-10-12 MED ORDER — HYDROCODONE-ACETAMINOPHEN 5-325 MG PO TABS: 1.0000 | ORAL_TABLET | Freq: Four times a day (QID) | ORAL | 0 refills | Status: DC | PRN

## 2022-10-12 NOTE — Patient Instructions (Addendum)
Your prescription for Nifedipine ointment has been called into [facility] in [place]. This is a compounded medication and they are the only pharmacy that does this. Insurance does not normally cover this medication and the cost is $56. Apply a small amount to your finger and place this just inside the rectum three times a day for 6 weeks.  The drug store will call you to let you know when this prescription is ready. [facility] is located at [address] [place], [place] [address] PH: [phone]  Hemorrhoids Hemorrhoids are swollen veins that may form: In the butt (rectum). These are called internal hemorrhoids. Around the opening of the butt (anus). These are called external hemorrhoids. Most hemorrhoids do not cause very bad problems. They often get better with changes to your lifestyle and what you eat. What are the causes? Having trouble pooping (constipation) or watery poop (diarrhea). Pushing too hard when you poop. Pregnancy. Being very overweight (obese). Sitting for too long. Riding a bike for a long time. Heavy lifting or other things that take a lot of effort. Anal sex. What are the signs or symptoms? Pain. Itching or soreness in the butt. Bleeding from the butt. Leaking poop. Swelling. One or more lumps around the opening of your butt. How is this treated? In most cases, hemorrhoids can be treated at home. You may be told to: Change what you eat. Make changes to your lifestyle. If these treatments do not help, you may need to have a procedure done. Your doctor may need to: Place rubber bands at the bottom of the hemorrhoids to make them fall off. Put medicine into the hemorrhoids to shrink them. Shine a type of light on the hemorrhoids to cause them to fall off. Do surgery to get rid of the hemorrhoids. Follow these instructions at home: Medicines Take over-the-counter and prescription medicines only as told by your doctor. Use creams with medicine in them  or medicines that you put in your butt as told by your doctor. Eating and drinking  Eat foods that have a lot of fiber in them. These include whole grains, beans, nuts, fruits, and vegetables. Ask your doctor about taking products that have fiber added to them (fibersupplements). Take in less fat. You can do this by: Eating low-fat dairy products. Eating less red meat. Staying away from processed foods. Drink enough fluid to keep your pee (urine) pale yellow. Managing pain and swelling  Take a warm-water bath (sitz bath) for 20 minutes to ease pain. Do this 3-4 times a day. You may do this in a bathtub. You may also use a portable sitz bath that fits over the toilet. If told, put ice on the painful area. It may help to use ice between your warm baths. Put ice in a plastic bag. Place a towel between your skin and the bag. Leave the ice on for 20 minutes, 2-3 times a day. If your skin turns bright red, take off the ice right away to prevent skin damage. The risk of damage is higher if you cannot feel pain, heat, or cold. General instructions Exercise. Ask your doctor how much and what kind of exercise is best for you. Go to the bathroom when you need to poop. Do not wait. Try not to push too hard when you poop. Keep your butt dry and clean. Use wet toilet paper or moist towelettes after you poop. Do not sit on the toilet for a long time. Contact a doctor if: You have  pain and swelling that do not get better with treatment. You have trouble pooping. You cannot poop. You have pain or swelling outside the area of the hemorrhoids. Get help right away if: You have bleeding from the butt that will not stop. This information is not intended to replace advice given to you by your health care provider. Make sure you discuss any questions you have with your health care provider. Document Revised: 12/03/2021 Document Reviewed: 12/03/2021 Elsevier Patient Education  2024 [employer].

## 2022-10-14 NOTE — Progress Notes (Signed)
Patient ID: William Ochoa, male   DOB: April 21, 1960, 62 y.o.   MRN: [id number]  HPI William Ochoa is a 62 y.o. male seen in consultation at the request of Dr. Tobi Bastos for questionable hemorrhoid.  He reports significant anorectal pain for several months. He endorses some bleeding when he wipes.,  He reports that the pain is intermittent worsening with bowel movements and is sharp and moderate in intensity.  He had a complex surgical history and was operated by Dr. Lemar Livings multiple times.  He had I&D's of perianal abscesses as well as fistulotomy's and debridements..  Did have an MRI at that time that have personally reviewed showing evidence of a perianal fistula. CBC and CMP is normal.  He is able to perform more than 4 METS of activity without any shortness of breath or chest pain.  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 He also completed a colonoscopy recently that I have also personally reviewed.  There is evidence of a small polyp in the ascending colon.  Please note that I have personally reviewed the operative reports.   HPI  Past Medical History:  Diagnosis Date   GERD (gastroesophageal reflux disease)    rare-no meds   Glaucoma 2015   Hepatitis 1970'S   A OR B-PT CANNOT REMEMBER WHICH ONE   Hyperlipidemia    Hypothyroidism    Thyroid disease    Wears dentures    full upper and lower    Past Surgical History:  Procedure Laterality Date   ANAL FISTULOTOMY N/A 01/03/2018   Procedure: ANAL FISTULOTOMY;  Surgeon: Earline Mayotte, MD;  Location: [facility];  Service: General;  Laterality: N/A;   ANAL FISTULOTOMY N/A 02/23/2018   Procedure: ANAL FISTULOTOMY;  Surgeon: Earline Mayotte, MD;  Location: [facility];  Service: General;  Laterality: N/A;   ARTHOSCOPIC ROTAOR CUFF REPAIR Right 06/21/2017   Procedure: ARTHROSCOPIC ROTATOR CUFF REPAIR;  Surgeon: Lyndle Herrlich, MD;  Location: [facility];   Service: Orthopedics;  Laterality: Right;   COLONOSCOPY WITH PROPOFOL N/A 09/01/2022   Procedure: COLONOSCOPY WITH PROPOFOL;  Surgeon: Wyline Mood, MD;  Location: [facility];  Service: Gastroenterology;  Laterality: N/A;   EVALUATION UNDER ANESTHESIA WITH ANAL FISTULECTOMY N/A 06/23/2018   Procedure: EXAM UNDER ANESTHESIA WITH ANAL FISTULECTOMY;  Surgeon: Earline Mayotte, MD;  Location: [facility];  Service: General;  Laterality: N/A;   EYE SURGERY Left 2016   CORNEAL SURGERY   FISTULOTOMY N/A 10/01/2015   Procedure: FISTULOTOMY;  Surgeon: Earline Mayotte, MD;  Location: [facility];  Service: General;  Laterality: N/A;   HEMORRHOID SURGERY  1990   INCISION AND DRAINAGE PERIRECTAL ABSCESS N/A 10/01/2015   Procedure: IRRIGATION AND DEBRIDEMENT PERIRECTAL ABSCESS;  Surgeon: Earline Mayotte, MD;  Location: [facility];  Service: General;  Laterality: N/A;   MASS EXCISION Left 06/06/2020   Procedure: EXCISION TUMOR FOOT-SUBQ LEFT;  Surgeon: Rosetta Posner, DPM;  Location: [facility];  Service: Podiatry;  Laterality: Left;   RESECTION DISTAL CLAVICAL  06/21/2017   Procedure: DISTAL CLAVICALEXCISION;  Surgeon: Lyndle Herrlich, MD;  Location: [facility];  Service: Orthopedics;;   SHOULDER ARTHROSCOPY WITH BICEPSTENOTOMY Right 06/21/2017   Procedure: SHOULDER ARTHROSCOPY WITH BICEPSTENOTOMY;  Surgeon: Lyndle Herrlich, MD;  Location: [facility];  Service: Orthopedics;  Laterality: Right;   SHOULDER ARTHROSCOPY WITH OPEN ROTATOR CUFF REPAIR Right 06/21/2017   Procedure: SHOULDER ARTHROSCOPY;  Surgeon: Lyndle Herrlich, MD;  Location: [facility];  Service: Orthopedics;  Laterality: Right;   SIGMOIDOSCOPY N/A 10/01/2015   Procedure: SIGMOIDOSCOPY;  Surgeon: Earline Mayotte, MD;  Location: [facility];  Service: General;  Laterality: N/A;   SUBACROMIAL DECOMPRESSION Right 06/21/2017   Procedure: SUBACROMIAL DECOMPRESSION;  Surgeon: Lyndle Herrlich, MD;  Location: [facility];  Service: Orthopedics;  Laterality:  Right;    Family History  Problem Relation Age of Onset   Cancer Mother        skin   Cancer Father        Prostate   Heart attack Father     Social History Social History   Tobacco Use   Smoking status: Every Day    Packs/day: 1.00    Years: 30.00    Additional pack years: 0.00    Total pack years: 30.00    Types: Cigarettes   Smokeless tobacco: Never  Vaping Use   Vaping Use: Never used  Substance Use Topics   Alcohol use: No    Comment: SOBER FOR 35 YEARS   Drug use: No    Comment: 3    Allergies  Allergen Reactions   Statins     Joint pain    Current Outpatient Medications  Medication Sig Dispense Refill   Carboxymeth-Glycerin-Polysorb (REFRESH OPTIVE ADVANCED) 0.5-1-0.5 % SOLN Place 1 drop into both eyes 2 (two) times daily as needed (for dry eyes).      ezetimibe (ZETIA) 10 MG tablet Take 1 tablet (10 mg total) by mouth daily. 90 tablet 3   HYDROcodone-acetaminophen (NORCO) 5-325 MG tablet Take 1-2 tablets by mouth every 6 (six) hours as needed for moderate pain. 15 tablet 0   ibuprofen (ADVIL,MOTRIN) 200 MG tablet Take 200 mg by mouth every 6 (six) hours as needed for headache or moderate pain.     levothyroxine (SYNTHROID) 150 MCG tablet Take 150 mcg by mouth daily.     Multiple Vitamins-Minerals (CENTRUM ADULTS PO) Take 1 tablet by mouth daily.      nystatin-triamcinolone ointment (MYCOLOG) Apply 1 application topically 3 (three) times daily. 30 g 1   tadalafil (CIALIS) 5 MG tablet Take 5 mg by mouth daily as needed for erectile dysfunction.     vardenafil (LEVITRA) 10 MG tablet Take 10 mg by mouth as needed.     No current facility-administered medications for this visit.     Review of Systems Full ROS  was asked and was negative except for the information on the HPI  Physical Exam Blood pressure 135/86, pulse 77, temperature 98.5 F (36.9 C), temperature source Oral, height 5\' 8"  (1.727 m), weight 184 lb 12.8 oz (83.8 kg), SpO2 98  %. CONSTITUTIONAL: NAD. EYES: Pupils are equal, round,  Sclera are non-icteric. EARS, NOSE, MOUTH AND THROAT: The oropharynx is clear. The oral mucosa is pink and moist. Hearing is intact to voice. LYMPH NODES:  Lymph nodes in the neck are normal. RESPIRATORY:  Lungs are clear. There is normal respiratory effort, with equal breath sounds bilaterally, and without pathologic use of accessory muscles. CARDIOVASCULAR: Heart is regular without murmurs, gallops, or rubs. GI: The abdomen is  soft, nontender, and nondistended. There are no palpable masses. There is no hepatosplenomegaly. There are normal bowel sounds in all quadrants. Rectal: Rectal exam is particularly tender in posterior midline consistent with fissure. In addition there is internal as well as external component hemorrhoid left anterolateral position.  It is tender.  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  No intraluminal rectal masses.  Sphincter is significantly increased MUSCULOSKELETAL: Normal muscle strength and tone. No cyanosis or edema.   SKIN: Turgor is good and there are no pathologic skin lesions or ulcers. NEUROLOGIC: Motor and sensation is grossly normal. Cranial nerves are grossly intact. PSYCH:  Oriented to person, place and time. Affect is normal.  Data Reviewed  I have personally reviewed the patient's imaging, laboratory findings and medical records.    Assessment/Plan 62 yo chronic anorectal pain and what seems to be a symptomatic external hemorrhoid with an internal component.  This seems to be chronic. Think that there might be a component of anal fissure as well.  The patient wishes to have these external hemorrhoid excised at this time.  Seems to have a chronic clot.  Procedure discussed with the patient in detail.  Risk, benefits and possible mucosectomy but not limited to: Bleeding, infection and chronic pain.  He wishes to proceed.  He wants to have this done here under local.  Regarding the anal fissure we  talked about sitz bath's and about nifedipine cream and potentially doing Botox injection.  We will prescribe the cream sitz bath's and medical therapy and we will continue to follow him and see him in about 3 weeks  Please note that I spent 55 minutes in this encounter including personally reviewing multiple medical records, images studies,, coordinating his care, performing appropriate documentation .  A copy of this report was sent to the referring provider   PROCEDURE NOTE Excision of external hemorrhoid with an internal component.  FINDINGS Chronic external hemorrhoid thrombosed with an internal component located in left anterolateral position  ANESTHESIA: lidocaine 1% w epi 10cc  She was placed in lateral position.  Tape was used to obtain adequate exposure and the perianal area was prepped and draped in the usual fashion.  Local anesthetic was infiltrated and the hemorrhoid was elevated.  Using a 15 blade knife the hemorrhoidal cushions were excised.  Pressure was obtained with hemostasis. Specimen was sent.      Sterling Big, MD FACS General Surgeon 10/14/2022, 1:36 PM

## 2022-10-15 ENCOUNTER — Encounter: Payer: Self-pay | Admitting: Surgery

## 2022-11-02 ENCOUNTER — Ambulatory Visit: Payer: 59 | Admitting: Surgery

## 2023-03-29 ENCOUNTER — Encounter: Payer: Self-pay | Admitting: *Deleted

## 2023-04-06 ENCOUNTER — Ambulatory Visit: Payer: BLUE CROSS/BLUE SHIELD | Attending: Cardiology | Admitting: Cardiology

## 2023-04-08 ENCOUNTER — Encounter: Payer: Self-pay | Admitting: Cardiology

## 2023-12-01 NOTE — Progress Notes (Signed)
 12/10/23 10:49 AM   William Ochoa [place] 1960-04-30 [id number]  CC: LUTS   HPI: 63 year old male here for initial evaluation of LUTS and erectile dysfunction He has never been evaluated by urologist  LUTS: Chronic progressive urinary symptoms, IPSS 23/5.  Mixed obstructive and irritative voiding complaints.  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  BPH, severe dysfunctional voiding in brother  PMH: Past Medical History:  Diagnosis Date   GERD (gastroesophageal reflux disease)    rare-no meds   Glaucoma 2015   Hepatitis 1970'S   A OR B-PT CANNOT REMEMBER WHICH ONE   Hyperlipidemia    Hypothyroidism    Thyroid disease    Wears dentures    full upper and lower    Surgical History: Past Surgical History:  Procedure Laterality Date   ANAL FISTULOTOMY N/A 01/03/2018   Procedure: ANAL FISTULOTOMY;  Surgeon: Dessa Reyes ORN, MD;  Location: [facility];  Service: General;  Laterality: N/A;   ANAL FISTULOTOMY N/A 02/23/2018   Procedure: ANAL FISTULOTOMY;  Surgeon: Dessa Reyes ORN, MD;  Location: [facility];  Service: General;  Laterality: N/A;   ARTHOSCOPIC ROTAOR CUFF REPAIR Right 06/21/2017   Procedure: ARTHROSCOPIC ROTATOR CUFF REPAIR;  Surgeon: Leora Lynwood SAUNDERS, MD;  Location: [facility];  Service: Orthopedics;  Laterality: Right;   COLONOSCOPY WITH PROPOFOL  N/A 09/01/2022   Procedure: COLONOSCOPY WITH PROPOFOL ;  Surgeon: Therisa Bi, MD;  Location: [facility];  Service: Gastroenterology;   Laterality: N/A;   EVALUATION UNDER ANESTHESIA WITH ANAL FISTULECTOMY N/A 06/23/2018   Procedure: EXAM UNDER ANESTHESIA WITH ANAL FISTULECTOMY;  Surgeon: Dessa Reyes ORN, MD;  Location: [facility];  Service: General;  Laterality: N/A;   EYE SURGERY Left 2016   CORNEAL SURGERY   FISTULOTOMY N/A 10/01/2015   Procedure: FISTULOTOMY;  Surgeon: Reyes ORN Dessa, MD;  Location: [facility];  Service: General;  Laterality: N/A;   HEMORRHOID SURGERY  1990   INCISION AND DRAINAGE PERIRECTAL ABSCESS N/A 10/01/2015   Procedure: IRRIGATION AND DEBRIDEMENT PERIRECTAL ABSCESS;  Surgeon: Reyes ORN Dessa, MD;  Location: [facility];  Service: General;  Laterality: N/A;   MASS EXCISION Left 06/06/2020   Procedure: EXCISION TUMOR FOOT-SUBQ LEFT;  Surgeon: Lennie Barter, DPM;  Location: [facility];  Service: Podiatry;  Laterality: Left;   RESECTION DISTAL CLAVICAL  06/21/2017   Procedure: DISTAL CLAVICALEXCISION;  Surgeon: Leora Lynwood SAUNDERS, MD;  Location: [facility];  Service: Orthopedics;;   SHOULDER ARTHROSCOPY WITH BICEPSTENOTOMY Right 06/21/2017   Procedure: SHOULDER ARTHROSCOPY WITH BICEPSTENOTOMY;  Surgeon: Leora Lynwood SAUNDERS, MD;  Location: [facility];  Service: Orthopedics;  Laterality: Right;   SHOULDER ARTHROSCOPY WITH OPEN ROTATOR CUFF REPAIR Right 06/21/2017   Procedure: SHOULDER ARTHROSCOPY;  Surgeon: Leora Lynwood SAUNDERS, MD;  Location: [facility];  Service: Orthopedics;  Laterality: Right;   SIGMOIDOSCOPY N/A 10/01/2015   Procedure: SIGMOIDOSCOPY;  Surgeon: Reyes ORN Dessa, MD;  Location: [facility];  Service: General;  Laterality: N/A;   SUBACROMIAL DECOMPRESSION Right 06/21/2017   Procedure: SUBACROMIAL DECOMPRESSION;  Surgeon: Leora Lynwood SAUNDERS,  MD;  Location: [facility];  Service: Orthopedics;  Laterality: Right;    Family History: Family History  Problem Relation Age of Onset   Cancer Mother        skin   Cancer Father        Prostate   Heart attack Father     Social History:  reports that he has been  smoking cigarettes. He has a 30 pack-year smoking history. He has never used smokeless tobacco. He reports that he does not drink alcohol and does not use drugs.  Physical Exam: BP 128/85   Pulse 83   Ht 5' 8 (1.727 m)   Wt 191 lb (86.6 kg)   BMI 29.04 kg/m    Constitutional:  Alert and oriented, No acute distress. Cardiovascular: No clubbing, cyanosis, or edema. Respiratory: Normal respiratory effort, no increased work of breathing. GI: Nondistended GU: 30-40 g prostate, symmetric, no nodules, no rectal blood Skin: No rashes, bruises or suspicious lesions. Neurologic: Grossly intact, no focal deficits, moving all 4 extremities. Psychiatric: Normal mood and affect.  Laboratory Data:  Latest Reference Range & Units 08/03/22 11:18  Creatinine 0.61 - 1.24 mg/dL 9.18     Pertinent Imaging: N/A   Assessment & Plan:    Lower urinary tract symptoms (LUTS) Assessment & Plan: Today we reviewed the physiology and common causes of male lower urinary tract symptoms (LUTS). Discussed potential etiologies including infectious, inflammatory, bladder-related, benign prostatic hyperplasia (BPH), and musculoskeletal/pelvic floor contributions. Reviewed the standard diagnostic workup (urinalysis, PVR, uroflow, prostate assessment, possible cystoscopy or imaging) and the spectrum of initial management strategies ranging from behavioral and lifestyle measures to pharmacologic therapy, with procedural options if indicated. All questions were addressed and the patient expressed understanding of the evaluation and treatment pathway.  - Severe symptoms, patient desires aggressive workup and management - Start Flomax  0.4 mg nightly - Schedule cystoscopy/TRUS prostate sizing - Consider combo medical therapy versus candidacy for outlet procedure  Orders: -     Tamsulosin  HCl; Take 1 capsule (0.4 mg total) by mouth daily.  Dispense: 30 capsule; Refill: 3  ED (erectile dysfunction) of organic  origin Assessment & Plan: On Vardenafil 10mg , takes intermittently Good effect with use  We reviewed the basic tenets of erectile dysfunction management, including normal erectile physiology, common contributing factors, and the spectrum of therapeutic options. Conservative measures such as lifestyle modification, optimization of comorbid conditions, and avoidance of exacerbating medications were discussed. Pharmacologic options including PDE5 inhibitors, intracavernosal or intraurethral therapies, and vacuum erection devices were reviewed, as well as surgical approaches such as penile prosthesis implantation. All questions were answered.  -Continue as needed dosing, he was encouraged to use as needed -Low T workup per PCP-although it appears he had a total T of 550        Penne Skye, MD 12/10/2023  [facility] [address], [address] [place], [place] [address] [phone]

## 2023-12-10 ENCOUNTER — Ambulatory Visit: Admitting: Urology

## 2023-12-10 VITALS — BP 128/85 | HR 83 | Ht 68.0 in | Wt 191.0 lb

## 2023-12-10 DIAGNOSIS — N529 Male erectile dysfunction, unspecified: Secondary | ICD-10-CM | POA: Diagnosis not present

## 2023-12-10 DIAGNOSIS — R399 Unspecified symptoms and signs involving the genitourinary system: Secondary | ICD-10-CM

## 2023-12-10 DIAGNOSIS — R972 Elevated prostate specific antigen [PSA]: Secondary | ICD-10-CM | POA: Diagnosis not present

## 2023-12-10 MED ORDER — DIAZEPAM 5 MG PO TABS
5.0000 mg | ORAL_TABLET | Freq: Four times a day (QID) | ORAL | 0 refills | Status: DC | PRN
Start: 2023-12-10 — End: 2024-02-28

## 2023-12-10 MED ORDER — TAMSULOSIN HCL 0.4 MG PO CAPS
0.4000 mg | ORAL_CAPSULE | Freq: Every day | ORAL | 3 refills | Status: DC
Start: 2023-12-10 — End: 2024-02-28

## 2023-12-10 NOTE — Assessment & Plan Note (Addendum)
 Today we reviewed the physiology and common causes of male lower urinary tract symptoms (LUTS). Discussed potential etiologies including infectious, inflammatory, bladder-related, benign prostatic hyperplasia (BPH), and musculoskeletal/pelvic floor contributions. Reviewed the standard diagnostic workup (urinalysis, PVR, uroflow, prostate assessment, possible cystoscopy or imaging) and the spectrum of initial management strategies ranging from behavioral and lifestyle measures to pharmacologic therapy, with procedural options if indicated. All questions were addressed and the patient expressed understanding of the evaluation and treatment pathway.  - Severe symptoms, patient desires aggressive workup and management - Start Flomax  0.4 mg nightly - Schedule cystoscopy/TRUS prostate sizing - Consider combo medical therapy versus candidacy for outlet procedure

## 2023-12-10 NOTE — Assessment & Plan Note (Signed)
 PSA 6.0 (11/29/2023) Family history of prostate cancer in father (diagnosed age 74s) No prior PSA data DRE -30-40 g benign  - Repeat PSA in 2 to 3 weeks at time of follow-up -Will characterize prostate anatomy and volume, calculate PSAD -If persistent elevation, will consider prostate MRI

## 2023-12-10 NOTE — Patient Instructions (Signed)

## 2023-12-10 NOTE — Assessment & Plan Note (Addendum)
 On Vardenafil 10mg , takes intermittently Good effect with use  We reviewed the basic tenets of erectile dysfunction management, including normal erectile physiology, common contributing factors, and the spectrum of therapeutic options. Conservative measures such as lifestyle modification, optimization of comorbid conditions, and avoidance of exacerbating medications were discussed. Pharmacologic options including PDE5 inhibitors, intracavernosal or intraurethral therapies, and vacuum erection devices were reviewed, as well as surgical approaches such as penile prosthesis implantation. All questions were answered.  -Continue as needed dosing, he was encouraged to use as needed -Low T workup per PCP-although it appears he had a total T of 550

## 2023-12-13 ENCOUNTER — Ambulatory Visit: Admitting: Urology

## 2023-12-21 NOTE — Progress Notes (Signed)
   12/31/2023 2:15 PM   Lloyd Ayo Mander 03/31/1961 [id number]  Cystoscopy and TRUS Prostate Procedure Note:  Indication:  Severe LUTS on Flomax  Significant caffeine intake IPSS 23/5  Cystoscopy: After informed consent and discussion of the procedure and its risks, KEENA HEESCH was positioned and prepped in the standard fashion. Cystoscopy was performed with a flexible cystoscope. The urethra, bladder neck and bladder mucosa were visualized in a systematic fashion. The ureteral orifices were noted in orthotopic location and orientation. There were no bladder mucosal lesions, stones, debris.  Notably the bladder had diffuse moderate trabeculations and posterior wall cellules. The prostate gland was trilobar with severe occlusive hypertrophy.  Primarily lateral lobe BPH, but with a small to moderate-sized intravesical median lobe.  Transrectal US : The patient was repositioned in left lateral decubitus. A transrectal ultrasound probe was introduced and the prostate gland was surveyed. There were no hypoechoic lesions or abnormal capsular contours. There did appear to be a moderate intravesical median lobe. Measurements were taken, sizing the total prostate volume at 56cc.   Findings: Trilobar obstructive BPH 56 g gland Signs of bladder decompensation (moderate diffuse trabeculations /posterior wall cellules)  Assessment and Plan: - Follow-up with Dr. Francisca for surgical outlet procedure discussion  PSA 6.0 (11/29/2023)-collected via community clinic, no prior data - repeat PSA today  Penne Skye, MD 12/21/2023

## 2023-12-31 ENCOUNTER — Ambulatory Visit: Payer: Self-pay | Admitting: Urology

## 2023-12-31 ENCOUNTER — Ambulatory Visit (INDEPENDENT_AMBULATORY_CARE_PROVIDER_SITE_OTHER): Admitting: Urology

## 2023-12-31 ENCOUNTER — Encounter: Payer: Self-pay | Admitting: Urology

## 2023-12-31 VITALS — BP 102/65 | HR 76 | Ht 68.0 in | Wt 190.4 lb

## 2023-12-31 DIAGNOSIS — R399 Unspecified symptoms and signs involving the genitourinary system: Secondary | ICD-10-CM

## 2023-12-31 LAB — URINALYSIS, COMPLETE
Bilirubin, UA: NEGATIVE
Glucose, UA: NEGATIVE
Ketones, UA: NEGATIVE
Nitrite, UA: NEGATIVE
Specific Gravity, UA: 1.015 (ref 1.005–1.030)
Urobilinogen, Ur: 1 mg/dL (ref 0.2–1.0)
pH, UA: 6.5 (ref 5.0–7.5)

## 2023-12-31 LAB — MICROSCOPIC EXAMINATION

## 2023-12-31 MED ORDER — LIDOCAINE HCL URETHRAL/MUCOSAL 2 % EX GEL
1.0000 | Freq: Once | CUTANEOUS | Status: AC
Start: 2023-12-31 — End: 2023-12-31
  Administered 2023-12-31: 1 via URETHRAL

## 2023-12-31 NOTE — Addendum Note (Signed)
 Addended by: Aaryn Parrilla E on: 12/31/2023 12:12 PM   Modules accepted: Orders

## 2023-12-31 NOTE — Progress Notes (Signed)
 Sending for reflex culture. Unclear if this represents a true acute UTI with his chronic LUTS, although we will check for any pathogenic bacteria. Hold on antibiotics unless new UTI symptoms off baseline.

## 2024-01-13 ENCOUNTER — Ambulatory Visit (INDEPENDENT_AMBULATORY_CARE_PROVIDER_SITE_OTHER): Admitting: Urology

## 2024-01-13 ENCOUNTER — Other Ambulatory Visit
Admission: RE | Admit: 2024-01-13 | Discharge: 2024-01-13 | Disposition: A | Discharge status: Home / Self Care | Attending: Urology | Admitting: Urology

## 2024-01-13 VITALS — BP 112/72 | HR 85 | Wt 190.0 lb

## 2024-01-13 DIAGNOSIS — R399 Unspecified symptoms and signs involving the genitourinary system: Secondary | ICD-10-CM | POA: Insufficient documentation

## 2024-01-13 DIAGNOSIS — R972 Elevated prostate specific antigen [PSA]: Secondary | ICD-10-CM

## 2024-01-13 LAB — PSA: Prostatic Specific Antigen: 7.56 ng/mL — ABNORMAL HIGH (ref 0.00–4.00)

## 2024-01-13 LAB — URINALYSIS, COMPLETE (UACMP) WITH MICROSCOPIC
Bilirubin Urine: NEGATIVE
Glucose, UA: NEGATIVE mg/dL
Ketones, ur: NEGATIVE mg/dL
Nitrite: NEGATIVE
Protein, ur: NEGATIVE mg/dL
Specific Gravity, Urine: 1.01 (ref 1.005–1.030)
pH: 7 (ref 5.0–8.0)

## 2024-01-13 NOTE — Patient Instructions (Signed)

## 2024-01-13 NOTE — Progress Notes (Signed)
   01/13/2024 11:15 AM   William Ochoa [birth year] [id number]  Reason for visit: BPH with obstruction, discuss HOLEP, elevated PSA  History: Has been followed by Dr. Georganne, referred to consider HOLEP 56 g prostate, cystoscopy with obstruction and severe bladder trabeculations Obstructive symptoms, nocturia Mildly elevated PSA of 6, low PSA density 0.1  Physical Exam: BP 112/72 (BP Location: Left Arm, Patient Position: Sitting, Cuff Size: Large)   Pulse 85   Wt 190 lb (86.2 kg)   SpO2 96%   BMI 28.89 kg/m    Today: Interested in definitive treatments, only mild improvement with Flomax   Plan:   BPH: We discussed the risks and benefits of HoLEP at length.  The procedure requires general anesthesia and takes 1 to 2 hours, and a holmium laser is used to enucleate the prostate and push this tissue into the bladder.  A morcellator is then used to remove this tissue, which is sent for pathology.  The vast majority(>95%) of patients are able to discharge the same day with a catheter in place for 2 to 3 days, and will follow-up in clinic for a voiding trial.  We specifically discussed the risks of bleeding, infection, retrograde ejaculation, temporary urgency and urge incontinence, very low risk of long-term incontinence, urethral stricture/bladder neck contracture, pathologic evaluation of prostate tissue and possible detection of prostate cancer or other malignancy, and possible need for additional procedures. Elevated PSA: Low PSA density, repeat PSA ordered, he defers biopsy prior to HOLEP, understands potential risk for prostate cancer that could require additional treatments Schedule HOLEP(56g)   William JAYSON Burnet, MD  [facility] [address] [place], [place] [address] [phone]

## 2024-01-14 ENCOUNTER — Other Ambulatory Visit: Payer: Self-pay

## 2024-01-14 DIAGNOSIS — N401 Enlarged prostate with lower urinary tract symptoms: Secondary | ICD-10-CM

## 2024-01-14 LAB — URINE CULTURE: Culture: NO GROWTH

## 2024-01-14 NOTE — Progress Notes (Signed)
 Surgical Physician Order Form [facility] [place]  Dr. Francisca, MD  * Scheduling expectation : Next Available  *Length of Case: 1 hour  *Clearance needed: no  *Anticoagulation Instructions: Hold all anticoagulants  *Aspirin Instructions: Hold Aspirin  *Post-op visit Date/Instructions:  1-3 day cath removal  *Diagnosis: BPH w/urinary obstruction  *Procedure:  HOLEP (47350)   Additional orders: N/A  -Admit type: OUTpatient  -Anesthesia: General  -VTE Prophylaxis Standing Order SCD's       Other:   -Standing Lab Orders Per Anesthesia    Lab other: UA&Urine Culture 10/9  -Standing Test orders EKG/Chest x-ray per Anesthesia       Test other:   - Medications:  Ancef  2gm IV  -Other orders:  N/A

## 2024-01-20 ENCOUNTER — Telehealth: Payer: Self-pay

## 2024-01-20 NOTE — Progress Notes (Signed)
    [facility] Posting Form  Surgery Date: Date: 02/28/2024  Surgeon: Dr. Redell Burnet, MD  Inpt ( No  )   Outpt (Yes)   Obs ( No  )   Diagnosis: N40.1, N13.8 Benign Prostatic Hyperplasia with Urinary Obstruction  -CPT: 47350  Surgery: Holmium Laser Enucleation of the Prostate  Stop Anticoagulations: Yes and also hold ASA  Cardiac/Medical/Pulmonary Clearance needed: no  *Orders entered into EPIC  Date: 01/20/24   *Case booked in [place]  Date: 01/20/24  *Notified pt of Surgery: Date: 01/20/24  PRE-OP UA & CX: Yes, will obtain in clinic on 02/13/2024  *Placed into Prior Authorization Work [place] Date: 01/20/24  Assistant/laser/rep:No

## 2024-01-20 NOTE — Telephone Encounter (Signed)
 Per Dr. Francisca, Patient is to be scheduled for Holmium Laser Enucleation of the Prostate   William Ochoa was contacted and possible surgical dates were discussed, Monday November 24th, 2025 was agreed upon for surgery. Patient was instructed that Dr. Francisca will require them to provide a pre-op UA & CX prior to surgery. This was ordered and scheduled drop off appointment was made for 02/13/2024.    Patient was directed to call [phone] between 1-3pm the day before surgery to find out surgical arrival time.  Instructions were given not to eat or drink from midnight on the night before surgery and have a driver for the day of surgery. On the surgery day patient was instructed to enter through the Medical Mall entrance of [facility] report the Same Day Surgery desk.   Pre-Admit Testing will be in contact via phone to set up an interview with the anesthesia team to review your history and medications prior to surgery.   Reminder of this information was sent via Mail to the patient.

## 2024-02-22 ENCOUNTER — Encounter
Admission: RE | Admit: 2024-02-22 | Discharge: 2024-02-22 | Disposition: A | Discharge status: Home / Self Care | Source: Ambulatory Visit | Attending: Urology | Admitting: Urology

## 2024-02-22 DIAGNOSIS — Z01810 Encounter for preprocedural cardiovascular examination: Secondary | ICD-10-CM

## 2024-02-22 DIAGNOSIS — N138 Other obstructive and reflux uropathy: Secondary | ICD-10-CM

## 2024-02-22 DIAGNOSIS — Z01812 Encounter for preprocedural laboratory examination: Secondary | ICD-10-CM

## 2024-02-22 HISTORY — DX: Precordial pain: R07.2

## 2024-02-22 HISTORY — DX: Other obstructive and reflux uropathy: N40.1

## 2024-02-22 HISTORY — DX: Solitary pulmonary nodule: R91.1

## 2024-02-22 NOTE — Patient Instructions (Addendum)
 Your procedure is scheduled on: 02/28/24 - Monday Report to the Registration Desk on the 1st floor of the Medical Mall. To find out your arrival time, please call [phone] between 1PM - 3PM on: 02/25/23 - Friday If your arrival time is 6:00 am, do not arrive before that time as the Medical Mall entrance doors do not open until 6:00 am.  REMEMBER: Instructions that are not followed completely may result in serious medical risk, up to and including death; or upon the discretion of your surgeon and anesthesiologist your surgery may need to be rescheduled.  Do not eat food or drink any liquids  after midnight the night before surgery.  No gum chewing or hard candies.  One week prior to surgery: Stop Anti-inflammatories (NSAIDS) such as Advil, Aleve, Ibuprofen, Motrin, Naproxen, Naprosyn and Aspirin based products such as Excedrin, Goody's Powder, BC Powder. You may take Tylenol  if needed for pain up until the day of surgery.  Stop ANY OVER THE COUNTER supplements until after surgery : Multivitamin   ON THE DAY OF SURGERY ONLY TAKE THESE MEDICATIONS WITH SIPS OF WATER:  levothyroxine (SYNTHROID)    No Alcohol for 24 hours before or after surgery.  No Smoking including e-cigarettes for 24 hours before surgery.  No chewable tobacco products for at least 6 hours before surgery.  No nicotine patches on the day of surgery.  Do not use any recreational drugs for at least a week (preferably 2 weeks) before your surgery.  Please be advised that the combination of cocaine and anesthesia may have negative outcomes, up to and including death. If you test positive for cocaine, your surgery will be cancelled.  On the morning of surgery brush your teeth with toothpaste and water, you may rinse your mouth with mouthwash if you wish. Do not swallow any toothpaste or mouthwash.  Do not wear jewelry, make-up, hairpins, clips or nail polish.  For welded (permanent) jewelry: bracelets, anklets,  waist bands, etc.  Please have this removed prior to surgery.  If it is not removed, there is a chance that hospital personnel will need to cut it off on the day of surgery.  Do not wear lotions, powders, or perfumes.   Do not shave body hair from the neck down 48 hours before surgery.  Contact lenses, hearing aids and dentures may not be worn into surgery.  Do not bring valuables to the hospital. [facility] is not responsible for any missing/lost belongings or valuables.   Notify your doctor if there is any change in your medical condition (cold, fever, infection).  Wear comfortable clothing (specific to your surgery type) to the hospital.  After surgery, you can help prevent lung complications by doing breathing exercises.  Take deep breaths and cough every 1-2 hours. Your doctor may order a device called an Incentive Spirometer to help you take deep breaths.  If you are being admitted to the hospital overnight, leave your suitcase in the car. After surgery it may be brought to your room.  In case of increased patient census, it may be necessary for you, the patient, to continue your postoperative care in the Same Day Surgery department.  If you are being discharged the day of surgery, you will not be allowed to drive home. You will need a responsible individual to drive you home and stay with you for 24 hours after surgery.   If you are taking public transportation, you will need to have a responsible individual with you.  Please  call the Pre-admissions Testing Dept. at [phone] if you have any questions about these instructions.  Surgery Visitation Policy:  Patients having surgery or a procedure may have two visitors.  Children under the age of 82 must have an adult with them who is not the patient.  Inpatient Visitation:    Visiting hours are 7 a.m. to 8 p.m. Up to four visitors are allowed at one time in a patient room. The visitors may rotate out with other people  during the day.  One visitor age 65 or older may stay with the patient overnight and must be in the room by 8 p.m.   Merchandiser, Retail to address health-related social needs:  [email or web address]

## 2024-02-25 ENCOUNTER — Encounter
Admission: RE | Admit: 2024-02-25 | Discharge: 2024-02-25 | Disposition: A | Discharge status: Home / Self Care | Source: Ambulatory Visit | Attending: Urology | Admitting: Urology

## 2024-02-25 DIAGNOSIS — Z01818 Encounter for other preprocedural examination: Secondary | ICD-10-CM | POA: Diagnosis present

## 2024-02-25 DIAGNOSIS — I4510 Unspecified right bundle-branch block: Secondary | ICD-10-CM | POA: Insufficient documentation

## 2024-02-25 DIAGNOSIS — Z01810 Encounter for preprocedural cardiovascular examination: Secondary | ICD-10-CM

## 2024-02-25 DIAGNOSIS — Z01812 Encounter for preprocedural laboratory examination: Secondary | ICD-10-CM

## 2024-02-25 DIAGNOSIS — I517 Cardiomegaly: Secondary | ICD-10-CM | POA: Insufficient documentation

## 2024-02-25 LAB — CBC
HCT: 40.7 % (ref 39.0–52.0)
Hemoglobin: 13.9 g/dL (ref 13.0–17.0)
MCH: 32 pg (ref 26.0–34.0)
MCHC: 34.2 g/dL (ref 30.0–36.0)
MCV: 93.6 fL (ref 80.0–100.0)
Platelets: 239 K/uL (ref 150–400)
RBC: 4.35 MIL/uL (ref 4.22–5.81)
RDW: 13.2 % (ref 11.5–15.5)
WBC: 6.2 K/uL (ref 4.0–10.5)
nRBC: 0 % (ref 0.0–0.2)

## 2024-02-25 LAB — BASIC METABOLIC PANEL WITH GFR
Anion gap: 9 (ref 5–15)
BUN: 12 mg/dL (ref 8–23)
CO2: 27 mmol/L (ref 22–32)
Calcium: 9 mg/dL (ref 8.9–10.3)
Chloride: 102 mmol/L (ref 98–111)
Creatinine, Ser: 0.77 mg/dL (ref 0.61–1.24)
GFR, Estimated: >60 mL/min (ref >60)
Glucose, Bld: 95 mg/dL (ref 70–99)
Potassium: 4.4 mmol/L (ref 3.5–5.1)
Sodium: 138 mmol/L (ref 135–145)

## 2024-02-27 MED ORDER — ORAL CARE MOUTH RINSE: 15.0000 mL | Freq: Once | OROMUCOSAL | Status: AC

## 2024-02-27 MED ORDER — LACTATED RINGERS IV SOLN: INTRAVENOUS | Status: DC

## 2024-02-27 MED ORDER — CEFAZOLIN SODIUM-DEXTROSE 2-4 GM/100ML-% IV SOLN
2.0000 g | INTRAVENOUS | Status: AC
  Administered 2024-02-28: 2 g via INTRAVENOUS

## 2024-02-27 MED ORDER — CHLORHEXIDINE GLUCONATE 0.12 % MT SOLN
15.0000 mL | Freq: Once | OROMUCOSAL | Status: AC
  Administered 2024-02-28: 15 mL via OROMUCOSAL

## 2024-02-28 ENCOUNTER — Telehealth: Payer: Self-pay | Admitting: Urology

## 2024-02-28 ENCOUNTER — Ambulatory Visit: Payer: Self-pay | Admitting: Urgent Care

## 2024-02-28 ENCOUNTER — Ambulatory Visit
Admission: RE | Admit: 2024-02-28 | Discharge: 2024-02-28 | Disposition: A | Discharge status: Home / Self Care | Attending: Urology | Admitting: Urology

## 2024-02-28 ENCOUNTER — Encounter
Admission: RE | Disposition: A | Discharge status: Home / Self Care | Payer: Self-pay | Source: Home / Self Care | Attending: Urology

## 2024-02-28 ENCOUNTER — Other Ambulatory Visit: Payer: Self-pay

## 2024-02-28 ENCOUNTER — Encounter: Payer: Self-pay | Admitting: Urology

## 2024-02-28 ENCOUNTER — Other Ambulatory Visit: Payer: Self-pay | Admitting: Urology

## 2024-02-28 DIAGNOSIS — N3289 Other specified disorders of bladder: Secondary | ICD-10-CM | POA: Diagnosis not present

## 2024-02-28 DIAGNOSIS — N138 Other obstructive and reflux uropathy: Secondary | ICD-10-CM | POA: Insufficient documentation

## 2024-02-28 DIAGNOSIS — C61 Malignant neoplasm of prostate: Secondary | ICD-10-CM | POA: Insufficient documentation

## 2024-02-28 DIAGNOSIS — N401 Enlarged prostate with lower urinary tract symptoms: Secondary | ICD-10-CM | POA: Insufficient documentation

## 2024-02-28 DIAGNOSIS — F17210 Nicotine dependence, cigarettes, uncomplicated: Secondary | ICD-10-CM | POA: Diagnosis not present

## 2024-02-28 HISTORY — PX: HOLEP-LASER ENUCLEATION OF THE PROSTATE WITH MORCELLATION: SHX6641

## 2024-02-28 SURGERY — ENUCLEATION, PROSTATE, USING LASER, WITH MORCELLATION
Anesthesia: General | Site: Prostate

## 2024-02-28 MED ORDER — MIDAZOLAM HCL (PF) 2 MG/2ML IJ SOLN
INTRAMUSCULAR | Status: DC | PRN
  Administered 2024-02-28: 2 mg via INTRAVENOUS

## 2024-02-28 MED ORDER — CEFAZOLIN SODIUM-DEXTROSE 2-4 GM/100ML-% IV SOLN
INTRAVENOUS | Status: AC
  Filled 2024-02-28: qty 100

## 2024-02-28 MED ORDER — ACETAMINOPHEN 10 MG/ML IV SOLN
INTRAVENOUS | Status: AC
  Filled 2024-02-28: qty 100

## 2024-02-28 MED ORDER — OXYCODONE HCL 5 MG PO TABS: 5.0000 mg | ORAL_TABLET | Freq: Once | ORAL | Status: DC | PRN

## 2024-02-28 MED ORDER — CHLORHEXIDINE GLUCONATE 0.12 % MT SOLN
OROMUCOSAL | Status: AC
Start: 2024-02-28 — End: 2024-02-28
  Filled 2024-02-28: qty 15

## 2024-02-28 MED ORDER — ROCURONIUM BROMIDE 100 MG/10ML IV SOLN
INTRAVENOUS | Status: DC | PRN
Start: 2024-02-28 — End: 2024-02-28
  Administered 2024-02-28: 50 mg via INTRAVENOUS

## 2024-02-28 MED ORDER — SODIUM CHLORIDE 0.9 % IR SOLN
Status: DC | PRN
  Administered 2024-02-28: 15000 mL
  Administered 2024-02-28: 12000 mL

## 2024-02-28 MED ORDER — LIDOCAINE HCL (PF) 2 % IJ SOLN
INTRAMUSCULAR | Status: AC
  Filled 2024-02-28: qty 5

## 2024-02-28 MED ORDER — SUGAMMADEX SODIUM 200 MG/2ML IV SOLN
INTRAVENOUS | Status: DC | PRN
  Administered 2024-02-28: 50 mg via INTRAVENOUS
  Administered 2024-02-28: 100 mg via INTRAVENOUS
  Administered 2024-02-28: 50 mg via INTRAVENOUS

## 2024-02-28 MED ORDER — MIDAZOLAM HCL 2 MG/2ML IJ SOLN
INTRAMUSCULAR | Status: AC
  Filled 2024-02-28: qty 2

## 2024-02-28 MED ORDER — ROCURONIUM BROMIDE 10 MG/ML (PF) SYRINGE
PREFILLED_SYRINGE | INTRAVENOUS | Status: AC
  Filled 2024-02-28: qty 10

## 2024-02-28 MED ORDER — FENTANYL CITRATE (PF) 100 MCG/2ML IJ SOLN
INTRAMUSCULAR | Status: AC
  Filled 2024-02-28: qty 2

## 2024-02-28 MED ORDER — PROPOFOL 10 MG/ML IV BOLUS
INTRAVENOUS | Status: DC | PRN
Start: 2024-02-28 — End: 2024-02-28
  Administered 2024-02-28: 150 mg via INTRAVENOUS
  Administered 2024-02-28: 40 mg via INTRAVENOUS

## 2024-02-28 MED ORDER — ACETAMINOPHEN 10 MG/ML IV SOLN
INTRAVENOUS | Status: DC | PRN
  Administered 2024-02-28: 1000 mg via INTRAVENOUS

## 2024-02-28 MED ORDER — EPHEDRINE 5 MG/ML INJ
INTRAVENOUS | Status: AC
  Filled 2024-02-28: qty 5

## 2024-02-28 MED ORDER — STERILE WATER FOR IRRIGATION IR SOLN
Status: DC | PRN
  Administered 2024-02-28: 1000 mL

## 2024-02-28 MED ORDER — PHENYLEPHRINE 80 MCG/ML (10ML) SYRINGE FOR IV PUSH (FOR BLOOD PRESSURE SUPPORT)
PREFILLED_SYRINGE | INTRAVENOUS | Status: DC | PRN
  Administered 2024-02-28 (×3): 80 ug via INTRAVENOUS

## 2024-02-28 MED ORDER — HYDROCODONE-ACETAMINOPHEN 5-325 MG PO TABS: 1.0000 | ORAL_TABLET | Freq: Four times a day (QID) | ORAL | 0 refills | Status: AC | PRN

## 2024-02-28 MED ORDER — PROPOFOL 10 MG/ML IV BOLUS
INTRAVENOUS | Status: AC
  Filled 2024-02-28: qty 80

## 2024-02-28 MED ORDER — ONDANSETRON HCL 4 MG/2ML IJ SOLN
INTRAMUSCULAR | Status: AC
  Filled 2024-02-28: qty 2

## 2024-02-28 MED ORDER — LIDOCAINE HCL (CARDIAC) PF 100 MG/5ML IV SOSY
PREFILLED_SYRINGE | INTRAVENOUS | Status: DC | PRN
  Administered 2024-02-28: 100 mg via INTRAVENOUS

## 2024-02-28 MED ORDER — DEXAMETHASONE SOD PHOSPHATE PF 10 MG/ML IJ SOLN
INTRAMUSCULAR | Status: DC | PRN
Start: 2024-02-28 — End: 2024-02-28
  Administered 2024-02-28: 5 mg via INTRAVENOUS

## 2024-02-28 MED ORDER — TRAMADOL HCL 50 MG PO TABS: 25.0000 mg | ORAL_TABLET | Freq: Four times a day (QID) | ORAL | 0 refills | Status: AC | PRN

## 2024-02-28 MED ORDER — FENTANYL CITRATE (PF) 100 MCG/2ML IJ SOLN: 25.0000 ug | INTRAMUSCULAR | Status: DC | PRN

## 2024-02-28 MED ORDER — FENTANYL CITRATE (PF) 100 MCG/2ML IJ SOLN
INTRAMUSCULAR | Status: DC | PRN
  Administered 2024-02-28: 50 ug via INTRAVENOUS

## 2024-02-28 MED ORDER — EPHEDRINE SULFATE-NACL 50-0.9 MG/10ML-% IV SOSY
PREFILLED_SYRINGE | INTRAVENOUS | Status: DC | PRN
  Administered 2024-02-28: 10 mg via INTRAVENOUS

## 2024-02-28 MED ORDER — OXYCODONE HCL 5 MG/5ML PO SOLN: 5.0000 mg | Freq: Once | ORAL | Status: DC | PRN

## 2024-02-28 MED ORDER — ONDANSETRON HCL 4 MG/2ML IJ SOLN
INTRAMUSCULAR | Status: DC | PRN
  Administered 2024-02-28: 4 mg via INTRAVENOUS

## 2024-02-28 SURGICAL SUPPLY — 24 items
ADAPTER IRRIG TUBE 2 SPIKE SOL (ADAPTER) ×2 IMPLANT
BAG URO DRAIN 4000ML (MISCELLANEOUS) ×1 IMPLANT
CATH URETL OPEN END 4X70 (CATHETERS) ×1 IMPLANT
CATH URTH STD 24FR FL 3W 2 (CATHETERS) ×1 IMPLANT
CONTAINER COLLECT MORCELLATR (MISCELLANEOUS) ×1 IMPLANT
DRAPE UTILITY 15X26 TOWEL STRL (DRAPES) IMPLANT
FIBER LASER MOSES 550 DFL (Laser) ×1 IMPLANT
FILTER OVERFLOW MORCELLATOR (FILTER) ×1 IMPLANT
GLOVE BIOGEL PI IND STRL 7.5 (GLOVE) ×2 IMPLANT
GOWN STRL REUS W/ TWL LRG LVL3 (GOWN DISPOSABLE) ×1 IMPLANT
GOWN STRL REUS W/ TWL XL LVL3 (GOWN DISPOSABLE) ×1 IMPLANT
HOLDER FOLEY CATH W/STRAP (MISCELLANEOUS) ×1 IMPLANT
KIT TURNOVER CYSTO (KITS) ×1 IMPLANT
MEMBRANE SLNG YLW 17 FOR INST (MISCELLANEOUS) ×1 IMPLANT
MORCELLATOR ROTATION 4.75 335 (MISCELLANEOUS) ×1 IMPLANT
PACK CYSTO AR (MISCELLANEOUS) ×1 IMPLANT
SET CYSTO IRRIGATION (SET/KITS/TRAYS/PACK) ×1 IMPLANT
SET IRRIG Y-TYPE CYSTO (SET/KITS/TRAYS/PACK) ×1 IMPLANT
SLEEVE PROTECTION STRL DISP (MISCELLANEOUS) ×2 IMPLANT
SOL .9 NS 3000ML IRR UROMATIC (IV SOLUTION) ×5 IMPLANT
SOLN STERILE WATER BTL 1000 ML (IV SOLUTION) ×1 IMPLANT
SURGILUBE 2OZ TUBE FLIPTOP (MISCELLANEOUS) ×1 IMPLANT
SYRINGE TOOMEY IRRIG 70ML (MISCELLANEOUS) ×1 IMPLANT
TUBE PUMP MORCELLATOR PIRANHA (TUBING) ×1 IMPLANT

## 2024-02-28 NOTE — H&P (Signed)
 02/28/24 7:02 AM   William Ochoa [birth year] [id number]  CC: BPH with obstruction  HPI: 63 year old male with obstructive urinary symptoms and nocturia, only minimal improvement on Flomax  previously and he desired definitive treatment.  Prostate measured 56 g and cystoscopy showed obstruction with severe bladder trabeculations.  Has mildly elevated PSA, has deferred further workup with biopsy or MRI and understands possible need for additional treatment if clinically significant prostate cancer identified at time of HOLEP.   PMH: Past Medical History:  Diagnosis Date   BPH with urinary obstruction    GERD (gastroesophageal reflux disease)    rare-no meds   Glaucoma 2015   Hepatitis 1970'S   A OR B-PT CANNOT REMEMBER WHICH ONE   Hyperlipidemia    Hypothyroidism    Lung nodule    Precordial pain    Thyroid disease    Wears dentures    full upper and lower    Surgical History: Past Surgical History:  Procedure Laterality Date   ANAL FISTULOTOMY N/A 01/03/2018   Procedure: ANAL FISTULOTOMY;  Surgeon: Dessa Reyes ORN, MD;  Location: [facility] ORS;  Service: General;  Laterality: N/A;   ANAL FISTULOTOMY N/A 02/23/2018   Procedure: ANAL FISTULOTOMY;  Surgeon: Dessa Reyes ORN, MD;  Location: [facility] ORS;  Service: General;  Laterality: N/A;   ARTHOSCOPIC ROTAOR CUFF REPAIR Right 06/21/2017   Procedure: ARTHROSCOPIC ROTATOR CUFF REPAIR;  Surgeon: Leora Lynwood SAUNDERS, MD;  Location: [facility] ORS;  Service: Orthopedics;  Laterality: Right;   COLONOSCOPY WITH PROPOFOL  N/A 09/01/2022   Procedure: COLONOSCOPY WITH PROPOFOL ;  Surgeon: Therisa Bi, MD;  Location: [facility] ENDOSCOPY;  Service: Gastroenterology;  Laterality: N/A;   EVALUATION UNDER ANESTHESIA WITH ANAL FISTULECTOMY N/A 06/23/2018   Procedure: EXAM UNDER ANESTHESIA WITH ANAL FISTULECTOMY;  Surgeon: Dessa Reyes ORN, MD;  Location: [facility] ORS;  Service: General;  Laterality: N/A;   EYE SURGERY Left 2016   CORNEAL SURGERY   FISTULOTOMY  N/A 10/01/2015   Procedure: FISTULOTOMY;  Surgeon: Reyes ORN Dessa, MD;  Location: [facility] ORS;  Service: General;  Laterality: N/A;   HEMORRHOID SURGERY  1990   INCISION AND DRAINAGE PERIRECTAL ABSCESS N/A 10/01/2015   Procedure: IRRIGATION AND DEBRIDEMENT PERIRECTAL ABSCESS;  Surgeon: Reyes ORN Dessa, MD;  Location: [facility] ORS;  Service: General;  Laterality: N/A;   MASS EXCISION Left 06/06/2020   Procedure: EXCISION TUMOR FOOT-SUBQ LEFT;  Surgeon: Lennie Barter, DPM;  Location: [facility]ERY CNTR;  Service: Podiatry;  Laterality: Left;   RESECTION DISTAL CLAVICAL  06/21/2017   Procedure: DISTAL CLAVICALEXCISION;  Surgeon: Leora Lynwood SAUNDERS, MD;  Location: [facility] ORS;  Service: Orthopedics;;   SHOULDER ARTHROSCOPY WITH BICEPSTENOTOMY Right 06/21/2017   Procedure: SHOULDER ARTHROSCOPY WITH BICEPSTENOTOMY;  Surgeon: Leora Lynwood SAUNDERS, MD;  Location: [facility] ORS;  Service: Orthopedics;  Laterality: Right;   SHOULDER ARTHROSCOPY WITH OPEN ROTATOR CUFF REPAIR Right 06/21/2017   Procedure: SHOULDER ARTHROSCOPY;  Surgeon: Leora Lynwood SAUNDERS, MD;  Location: [facility] ORS;  Service: Orthopedics;  Laterality: Right;   SIGMOIDOSCOPY N/A 10/01/2015   Procedure: SIGMOIDOSCOPY;  Surgeon: Reyes ORN Dessa, MD;  Location: [facility] ORS;  Service: General;  Laterality: N/A;   SUBACROMIAL DECOMPRESSION Right 06/21/2017   Procedure: SUBACROMIAL DECOMPRESSION;  Surgeon: Leora Lynwood SAUNDERS, MD;  Location: [facility] ORS;  Service: Orthopedics;  Laterality: Right;      Family History: Family History  Problem Relation Age of Onset   Cancer Mother        skin   Cancer Father  Prostate   Heart attack Father     Social History:  reports that he has been smoking cigarettes. He has a 30 pack-year smoking history. He has never used smokeless tobacco. He reports that he does not drink alcohol and does not use drugs.  Physical Exam: BP 117/77   Pulse 80   Temp (!) 97 F (36.1 C) (Temporal)   Resp 16   Ht 5' 8 (1.727 m)   Wt 86.2 kg    SpO2 96%   BMI 28.89 kg/m    Constitutional:  Alert and oriented, No acute distress. Cardiovascular: Regular rate and rhythm Respiratory: Clear to auscultation bilaterally GI: Abdomen is soft, nontender, nondistended, no abdominal masses   Laboratory Data: Culture 10/9 no growth  Assessment & Plan:   63 year old male with obstructive urinary symptoms and nocturia, only minimal improvement on Flomax  previously and he desired definitive treatment.  Prostate measured 56 g and cystoscopy showed obstruction with severe bladder trabeculations.  Has mildly elevated PSA, has deferred further workup with biopsy or MRI and understands possible need for additional treatment if clinically significant prostate cancer identified at time of HOLEP.  We discussed the risks and benefits of HoLEP at length.  The procedure requires general anesthesia and takes 1 to 2 hours, and a holmium laser is used to enucleate the prostate and push this tissue into the bladder.  A morcellator is then used to remove this tissue, which is sent for pathology.  The vast majority(>95%) of patients are able to discharge the same day with a catheter in place for 2 to 3 days, and will follow-up in clinic for a voiding trial.  We specifically discussed the risks of bleeding, infection, retrograde ejaculation, temporary urgency and urge incontinence, very low risk of long-term incontinence, urethral stricture/bladder neck contracture, pathologic evaluation of prostate tissue and possible detection of prostate cancer or other malignancy, and possible need for additional procedures.   HOLEP today   Redell Burnet, MD 02/28/2024  [facility] [address], [address] [place], [place] [address] [phone]

## 2024-02-28 NOTE — Anesthesia Postprocedure Evaluation (Signed)
 Anesthesia Post Note  Patient: William Ochoa [place]  Procedure(s) Performed: ENUCLEATION, PROSTATE, USING LASER, WITH MORCELLATION (Prostate)  Patient location during evaluation: PACU Anesthesia Type: General Level of consciousness: awake and alert Pain management: pain level controlled Vital Signs Assessment: post-procedure vital signs reviewed and stable Respiratory status: spontaneous breathing, nonlabored ventilation, respiratory function stable and patient connected to nasal cannula oxygen Cardiovascular status: blood pressure returned to baseline and stable Postop Assessment: no apparent nausea or vomiting Anesthetic complications: no   No notable events documented.   Last Vitals:  Vitals:   02/28/24 0941 02/28/24 0950  BP: 125/80 118/78  Pulse: 71 82  Resp: 19 18  Temp: 36.5 C (!) 36.2 C  SpO2: 93% 96%    Last Pain:  Vitals:   02/28/24 0950  TempSrc: Temporal  PainSc: 0-No pain                 [name]

## 2024-02-28 NOTE — Transfer of Care (Signed)
 Immediate Anesthesia Transfer of Care Note  Patient: William Ochoa  Procedure(s) Performed: ENUCLEATION, PROSTATE, USING LASER, WITH MORCELLATION (Prostate)  Patient Location: PACU  Anesthesia Type:General  Level of Consciousness: awake, drowsy, and patient cooperative  Airway & Oxygen Therapy: Patient Spontanous Breathing and Patient connected to face mask oxygen  Post-op Assessment: Report given to RN, Post -op Vital signs reviewed and stable, and Patient moving all extremities  Post vital signs: Reviewed and stable  Last Vitals:  Vitals Value Taken Time  BP    Temp    Pulse 76 02/28/24 09:01  Resp 22 02/28/24 09:01  SpO2 100 % 02/28/24 09:01  Vitals shown include unfiled device data.  Last Pain:  Vitals:   02/28/24 0624  TempSrc: Temporal  PainSc: 0-No pain         Complications: No notable events documented.  See flowsheet for vital signs. - AS

## 2024-02-28 NOTE — Anesthesia Preprocedure Evaluation (Signed)
 Anesthesia Evaluation  Patient identified by MRN, date of birth, ID band Patient awake    Reviewed: Allergy & Precautions, NPO status , Patient's Chart, lab work & pertinent test results  History of Anesthesia Complications Negative for: history of anesthetic complications  Airway Mallampati: III  TM Distance: >3 FB Neck ROM: full    Dental  (+) Upper Dentures, Lower Dentures   Pulmonary neg pulmonary ROS, Current Smoker and Patient abstained from smoking.   Pulmonary exam normal        Cardiovascular negative cardio ROS Normal cardiovascular exam     Neuro/Psych negative neurological ROS  negative psych ROS   GI/Hepatic Neg liver ROS,GERD  ,,  Endo/Other  Hypothyroidism    Renal/GU      Musculoskeletal   Abdominal   Peds  Hematology negative hematology ROS (+)   Anesthesia Other Findings Past Medical History: No date: BPH with urinary obstruction No date: GERD (gastroesophageal reflux disease)     Comment:  rare-no meds 2015: Glaucoma 1970'S: Hepatitis     Comment:  A OR B-PT CANNOT REMEMBER WHICH ONE No date: Hyperlipidemia No date: Hypothyroidism No date: Lung nodule No date: Precordial pain No date: Thyroid disease No date: Wears dentures     Comment:  full upper and lower  Past Surgical History: 01/03/2018: ANAL FISTULOTOMY; N/A     Comment:  Procedure: ANAL FISTULOTOMY;  Surgeon: Dessa Reyes ORN, MD;  Location: [facility] ORS;  Service: General;                Laterality: N/A; 02/23/2018: ANAL FISTULOTOMY; N/A     Comment:  Procedure: ANAL FISTULOTOMY;  Surgeon: Dessa Reyes ORN, MD;  Location: [facility] ORS;  Service: General;                Laterality: N/A; 06/21/2017: ARTHOSCOPIC ROTAOR CUFF REPAIR; Right     Comment:  Procedure: ARTHROSCOPIC ROTATOR CUFF REPAIR;  Surgeon:               Leora Lynwood SAUNDERS, MD;  Location: [facility] ORS;  Service:               Orthopedics;   Laterality: Right; 09/01/2022: COLONOSCOPY WITH PROPOFOL ; N/A     Comment:  Procedure: COLONOSCOPY WITH PROPOFOL ;  Surgeon: Therisa Bi, MD;  Location: [facility] ENDOSCOPY;  Service:               Gastroenterology;  Laterality: N/A; 06/23/2018: EVALUATION UNDER ANESTHESIA WITH ANAL FISTULECTOMY; N/A     Comment:  Procedure: EXAM UNDER ANESTHESIA WITH ANAL FISTULECTOMY;              Surgeon: Dessa Reyes ORN, MD;  Location: [facility] ORS;                Service: General;  Laterality: N/A; 2016: EYE SURGERY; Left     Comment:  CORNEAL SURGERY 10/01/2015: FISTULOTOMY; N/A     Comment:  Procedure: FISTULOTOMY;  Surgeon: Reyes ORN Dessa, MD;              Location: [facility] ORS;  Service: General;  Laterality: N/A; 1990: HEMORRHOID SURGERY 10/01/2015: INCISION AND DRAINAGE PERIRECTAL ABSCESS; N/A     Comment:  Procedure: IRRIGATION AND DEBRIDEMENT PERIRECTAL  ABSCESS;  Surgeon: Reyes LELON Cota, MD;  Location: [facility]              ORS;  Service: General;  Laterality: N/A; 06/06/2020: MASS EXCISION; Left     Comment:  Procedure: EXCISION TUMOR FOOT-SUBQ LEFT;  Surgeon:               Lennie Barter, DPM;  Location: [facility];                Service: Podiatry;  Laterality: Left; 06/21/2017: RESECTION DISTAL CLAVICAL     Comment:  Procedure: DISTAL CLAVICALEXCISION;  Surgeon: Leora Lynwood SAUNDERS, MD;  Location: [facility] ORS;  Service: Orthopedics;; 06/21/2017: SHOULDER ARTHROSCOPY WITH BICEPSTENOTOMY; Right     Comment:  Procedure: SHOULDER ARTHROSCOPY WITH BICEPSTENOTOMY;                Surgeon: Leora Lynwood SAUNDERS, MD;  Location: [facility] ORS;                Service: Orthopedics;  Laterality: Right; 06/21/2017: SHOULDER ARTHROSCOPY WITH OPEN ROTATOR CUFF REPAIR; Right     Comment:  Procedure: SHOULDER ARTHROSCOPY;  Surgeon: Leora Lynwood SAUNDERS, MD;  Location: [facility] ORS;  Service: Orthopedics;                Laterality: Right; 10/01/2015: SIGMOIDOSCOPY; N/A     Comment:   Procedure: SIGMOIDOSCOPY;  Surgeon: Reyes LELON Cota,               MD;  Location: [facility] ORS;  Service: General;  Laterality:               N/A; 06/21/2017: SUBACROMIAL DECOMPRESSION; Right     Comment:  Procedure: SUBACROMIAL DECOMPRESSION;  Surgeon: Leora Lynwood SAUNDERS, MD;  Location: [facility] ORS;  Service: Orthopedics;               Laterality: Right;  BMI    Body Mass Index: 28.89 kg/m      Reproductive/Obstetrics negative OB ROS                              Anesthesia Physical Anesthesia Plan  ASA: 2  Anesthesia Plan: General ETT   Post-op Pain Management: Toradol  IV (intra-op)* and Ofirmev  IV (intra-op)*   Induction: Intravenous  PONV Risk Score and Plan: 2 and Ondansetron , Dexamethasone , Midazolam  and Treatment may vary due to age or medical condition  Airway Management Planned: Oral ETT  Additional Equipment:   Intra-op Plan:   Post-operative Plan: Extubation in OR  Informed Consent: I have reviewed the patients History and Physical, chart, labs and discussed the procedure including the risks, benefits and alternatives for the proposed anesthesia with the patient or authorized representative who has indicated his/her understanding and acceptance.     Dental Advisory Given  Plan Discussed with: Anesthesiologist, CRNA and Surgeon  Anesthesia Plan Comments: (Patient consented for risks of anesthesia including but not limited to:  - adverse reactions to medications - damage to eyes, teeth, lips or other oral mucosa - nerve damage due to positioning  - sore throat or hoarseness - Damage to heart, brain, nerves, lungs, other parts of body or loss of life  Patient voiced understanding and assent.)  Anesthesia Quick Evaluation

## 2024-02-28 NOTE — Anesthesia Procedure Notes (Signed)
 Procedure Name: Intubation Date/Time: 02/28/2024 7:56 AM  Performed by: Claudene Cornet, CRNAPre-anesthesia Checklist: Patient identified, Emergency Drugs available, Suction available and Patient being monitored Patient Re-evaluated:Patient Re-evaluated prior to induction Oxygen Delivery Method: Circle system utilized Preoxygenation: Pre-oxygenation with 100% oxygen Induction Type: IV induction and Cricoid Pressure applied Laryngoscope Size: Mac, McGrath and 4 Grade View: Grade I Tube type: Oral Tube size: 7.0 mm Number of attempts: 1 Airway Equipment and Method: Stylet and Video-laryngoscopy Placement Confirmation: ETT inserted through vocal cords under direct vision, positive ETCO2 and breath sounds checked- equal and bilateral Secured at: 21 cm Tube secured with: Tape Dental Injury: Teeth and Oropharynx as per pre-operative assessment

## 2024-02-28 NOTE — Op Note (Signed)
 Date of procedure: 02/28/24  Preoperative diagnosis:  BPH with obstruction  Postoperative diagnosis:  Same  Procedure: HoLEP (Holmium Laser Enucleation of the Prostate)  Surgeon: Redell Burnet, MD  Anesthesia: General  Complications: None  Intraoperative findings:  Moderate prostate with obstructive lateral lobes, moderate to severe bladder trabeculations, no suspicious bladder lesions Uncomplicated HOLEP, right ureteral orifice extremely close to bladder neck, verumontanum intact  EBL: Minimal  Specimens: Prostate chips  Enucleation time: 18 minutes  Morcellation time: 10 minutes  Intra-op weight: 50g  Drains: 24 French three-way, 60 cc in balloon  Indication: William Ochoa is a 63 y.o. patient with BPH and obstructive symptoms despite Flomax , opted for HOLEP.  After reviewing the management options for treatment, they elected to proceed with the above surgical procedure(s). We have discussed the potential benefits and risks of the procedure, side effects of the proposed treatment, the likelihood of the patient achieving the goals of the procedure, and any potential problems that might occur during the procedure or recuperation.  We specifically discussed the risks of bleeding, infection, hematuria and clot retention, need for additional procedures, possible overnight hospital stay, temporary urgency and incontinence, rare long-term incontinence, and retrograde ejaculation.  Informed consent has been obtained.   Description of procedure:  The patient was taken to the operating room and general anesthesia was induced.  The patient was placed in the dorsal lithotomy position, prepped and draped in the usual sterile fashion, and preoperative antibiotics were administered.  SCDs were placed for DVT prophylaxis.  A preoperative time-out was performed.   Fleeta Needs sounds were used to gently dilated the urethra up to 72F. The 51 French continuous flow resectoscope was inserted  into the urethra using the visual obturator  The prostate was large with obstructing lateral lobes and some bladder neck elevation. The bladder was thoroughly inspected and notable for moderate to severe trabeculations.  The ureteral orifices were located in orthotopic position, right ureteral orifice extremely close to the bladder neck.    The laser was set to 2 J and 60 Hz and early apical release was performed by making a circumferential mucosal incision proximal to the sphincter.  A lambda incision was then made proximal to the verumontanum.  The prostate was enucleated en bloc circumferentially into the bladder.  The capsule was examined and laser was used for meticulous hemostasis.    The 44 French resectoscope was then switched out for the 26 French nephroscope and prostate tissue was morcellated(Piranha) and the tissue sent to pathology.  A 24 French three-way catheter was inserted easily with the aid of a catheter guide, and 60 cc were placed in the balloon.  Urine was light pink.  The catheter irrigated easily with a Toomey syringe.  CBI was initiated.   The patient tolerated the procedure well without any immediate complications and was extubated and transferred to the recovery room in stable condition.  Urine was clear on fast CBI.  Disposition: Stable to PACU  Plan: Wean CBI in PACU, anticipate discharge home today with Foley removal in clinic in 2-3 days  Redell Burnet, MD 02/28/2024

## 2024-02-28 NOTE — Telephone Encounter (Signed)
 Patient called and stated that he would like hydrocodone  instead of tramadol . He said tramadol  does not work for him. Pharmacy is [facility] on [address]

## 2024-02-29 ENCOUNTER — Encounter: Payer: Self-pay | Admitting: Urology

## 2024-03-01 ENCOUNTER — Ambulatory Visit: Payer: Self-pay | Admitting: Urology

## 2024-03-01 ENCOUNTER — Ambulatory Visit (INDEPENDENT_AMBULATORY_CARE_PROVIDER_SITE_OTHER): Admitting: Physician Assistant

## 2024-03-01 VITALS — BP 124/82 | HR 90 | Ht 68.0 in | Wt 190.0 lb

## 2024-03-01 DIAGNOSIS — C61 Malignant neoplasm of prostate: Secondary | ICD-10-CM

## 2024-03-01 DIAGNOSIS — N401 Enlarged prostate with lower urinary tract symptoms: Secondary | ICD-10-CM

## 2024-03-01 DIAGNOSIS — N138 Other obstructive and reflux uropathy: Secondary | ICD-10-CM

## 2024-03-01 LAB — SURGICAL PATHOLOGY

## 2024-03-01 NOTE — Patient Instructions (Signed)

## 2024-03-01 NOTE — Progress Notes (Signed)
 Catheter Removal  Patient is present today for a catheter removal.  58ml of water  was drained from the balloon. A 24FR three-way foley cath was removed from the bladder, no complications were noted. Patient tolerated well.  Performed by: Perle Gibbon, PA-C   Additional notes: Counseled patient on normal postoperative findings including dysuria, gross hematuria, and urinary urgency/leakage. Counseled patient to begin Kegel exercises 3x10 sets daily to increase urinary control and wear absorbent products as needed for security. Written and verbal resources provided today. Surgical pathology with a small focus of Gleason 3+3 prostate cancer.  Results shared with patient.  I recommended surveillance and we will get a PSA prior to postop follow-up.  Follow up: Return in about 4 months (around 06/29/2024) for Postop f/u with Dr. Francisca with PSA prior.

## 2024-03-07 ENCOUNTER — Ambulatory Visit: Admitting: Physician Assistant

## 2024-03-07 VITALS — BP 122/80 | HR 86 | Ht 68.0 in | Wt 187.0 lb

## 2024-03-07 DIAGNOSIS — N99115 Postprocedural fossa navicularis urethral stricture: Secondary | ICD-10-CM | POA: Diagnosis not present

## 2024-03-07 DIAGNOSIS — N481 Balanitis: Secondary | ICD-10-CM

## 2024-03-07 DIAGNOSIS — R339 Retention of urine, unspecified: Secondary | ICD-10-CM

## 2024-03-07 LAB — BLADDER SCAN AMB NON-IMAGING

## 2024-03-07 MED ORDER — CLOTRIMAZOLE 1 % EX CREA: 1.0000 | TOPICAL_CREAM | Freq: Two times a day (BID) | CUTANEOUS | 0 refills | Status: AC

## 2024-03-10 NOTE — Telephone Encounter (Signed)
 Called pt no answer. Unable to leave voicemail as VM is full. 1st attempt.

## 2024-03-12 NOTE — Progress Notes (Signed)
 03/07/2024 9:43 PM   TAE VONADA 11-24-1960 [id number]  CC: Chief Complaint  Patient presents with   Follow-up   Urinary Retention   HPI: William Ochoa is a 63 y.o. male with PMH BPH who underwent HoLEP with Dr. Francisca on 02/28/2024 who presents today for evaluation of difficulty urinating.   I removed his catheter in clinic on POD 2.  Today he reports he initially had no difficulty urinating whatsoever after catheter removal, however after several days he developed a split stream as well as some straining and dysuria.  PVR 51 mL.  PMH: Past Medical History:  Diagnosis Date   BPH with urinary obstruction    GERD (gastroesophageal reflux disease)    rare-no meds   Glaucoma 2015   Hepatitis 1970'S   A OR B-PT CANNOT REMEMBER WHICH ONE   Hyperlipidemia    Hypothyroidism    Lung nodule    Precordial pain    Thyroid disease    Wears dentures    full upper and lower    Surgical History: Past Surgical History:  Procedure Laterality Date   ANAL FISTULOTOMY N/A 01/03/2018   Procedure: ANAL FISTULOTOMY;  Surgeon: Dessa Reyes ORN, MD;  Location: [facility];  Service: General;  Laterality: N/A;   ANAL FISTULOTOMY N/A 02/23/2018   Procedure: ANAL FISTULOTOMY;  Surgeon: Dessa Reyes ORN, MD;  Location: [facility];  Service: General;  Laterality: N/A;   ARTHOSCOPIC ROTAOR CUFF REPAIR Right 06/21/2017   Procedure: ARTHROSCOPIC ROTATOR CUFF REPAIR;  Surgeon: Leora Lynwood SAUNDERS, MD;  Location: [facility];  Service: Orthopedics;  Laterality: Right;   COLONOSCOPY WITH PROPOFOL  N/A 09/01/2022   Procedure: COLONOSCOPY WITH PROPOFOL ;  Surgeon: Therisa Bi, MD;  Location: [facility];  Service: Gastroenterology;  Laterality: N/A;   EVALUATION UNDER ANESTHESIA WITH ANAL FISTULECTOMY N/A 06/23/2018   Procedure: EXAM UNDER ANESTHESIA WITH ANAL FISTULECTOMY;  Surgeon: Dessa Reyes ORN, MD;  Location: [facility];  Service: General;  Laterality: N/A;   EYE SURGERY Left 2016   CORNEAL  SURGERY   FISTULOTOMY N/A 10/01/2015   Procedure: FISTULOTOMY;  Surgeon: Reyes ORN Dessa, MD;  Location: [facility];  Service: General;  Laterality: N/A;   HEMORRHOID SURGERY  1990   HOLEP-LASER ENUCLEATION OF THE PROSTATE WITH MORCELLATION N/A 02/28/2024   Procedure: ENUCLEATION, PROSTATE, USING LASER, WITH MORCELLATION;  Surgeon: Francisca Redell BROCKS, MD;  Location: [facility];  Service: Urology;  Laterality: N/A;   INCISION AND DRAINAGE PERIRECTAL ABSCESS N/A 10/01/2015   Procedure: IRRIGATION AND DEBRIDEMENT PERIRECTAL ABSCESS;  Surgeon: Reyes ORN Dessa, MD;  Location: [facility];  Service: General;  Laterality: N/A;   MASS EXCISION Left 06/06/2020   Procedure: EXCISION TUMOR FOOT-SUBQ LEFT;  Surgeon: Lennie Barter, DPM;  Location: [facility];  Service: Podiatry;  Laterality: Left;   RESECTION DISTAL CLAVICAL  06/21/2017   Procedure: DISTAL CLAVICALEXCISION;  Surgeon: Leora Lynwood SAUNDERS, MD;  Location: [facility];  Service: Orthopedics;;   SHOULDER ARTHROSCOPY WITH BICEPSTENOTOMY Right 06/21/2017   Procedure: SHOULDER ARTHROSCOPY WITH BICEPSTENOTOMY;  Surgeon: Leora Lynwood SAUNDERS, MD;  Location: [facility];  Service: Orthopedics;  Laterality: Right;   SHOULDER ARTHROSCOPY WITH OPEN ROTATOR CUFF REPAIR Right 06/21/2017   Procedure: SHOULDER ARTHROSCOPY;  Surgeon: Leora Lynwood SAUNDERS, MD;  Location: [facility];  Service: Orthopedics;  Laterality: Right;   SIGMOIDOSCOPY N/A 10/01/2015   Procedure: SIGMOIDOSCOPY;  Surgeon: Reyes ORN Dessa, MD;  Location: [facility];  Service: General;  Laterality: N/A;   SUBACROMIAL DECOMPRESSION Right 06/21/2017   Procedure:  SUBACROMIAL DECOMPRESSION;  Surgeon: Leora Lynwood SAUNDERS, MD;  Location: [facility];  Service: Orthopedics;  Laterality: Right;    Home Medications:  Allergies as of 03/07/2024       Reactions   Statins    Joint pain        Medication List        Accurate as of March 07, 2024 11:59 PM. If you have any questions, ask your nurse or doctor.           atorvastatin 10 MG tablet Commonly known as: LIPITOR Take 10 mg by mouth daily.   CENTRUM ADULTS PO Take 1 tablet by mouth daily.   clotrimazole  1 % cream Commonly known as: LOTRIMIN  Apply 1 Application topically 2 (two) times daily for 7 days.   levothyroxine 175 MCG tablet Commonly known as: SYNTHROID Take 175 mcg by mouth daily before breakfast.   nystatin -triamcinolone  ointment Commonly known as: MYCOLOG Apply 1 application topically 3 (three) times daily.   Refresh Optive Advanced 0.5-1-0.5 % Soln Generic drug: Carboxymeth-Glycerin-Polysorb Place 1 drop into both eyes 2 (two) times daily as needed (for dry eyes).   vardenafil 10 MG tablet Commonly known as: LEVITRA Take 10 mg by mouth as needed.        Allergies:  Allergies  Allergen Reactions   Statins     Joint pain    Family History: Family History  Problem Relation Age of Onset   Cancer Mother        skin   Cancer Father        Prostate   Heart attack Father     Social History:   reports that he has been smoking cigarettes. He has a 30 pack-year smoking history. He has never used smokeless tobacco. He reports that he does not drink alcohol and does not use drugs.  Physical Exam: BP 122/80   Pulse 86   Ht 5' 8 (1.727 m)   Wt 187 lb (84.8 kg)   SpO2 97%   BMI 28.43 kg/m   Constitutional:  Alert and oriented, no acute distress, nontoxic appearing HEENT: , AT Cardiovascular: No clubbing, cyanosis, or edema Respiratory: Normal respiratory effort, no increased work of breathing GU: Erythematous glans penis, narrowing of the distal urethra Skin: No rashes, bruises or suspicious lesions Neurologic: Grossly intact, no focal deficits, moving all 4 extremities Psychiatric: Normal mood and affect  Laboratory Data: Results for orders placed or performed in visit on 03/07/24  Bladder Scan (Post Void Residual) in office   Collection Time: 03/07/24  9:06 AM  Result Value Ref Range   Scan Result  51ml    In and Out Catheterization  Patient is present today for urethral sounding due to concern for fossa navicularis stricture. Patient was cleaned and prepped in a sterile fashion with betadine  . A 24FR cath was inserted to a depth of 3 to 4 cm and there was significant resistance with insertion at the distal urethra consistent with fossa navicularis stricture.  I was ultimately able to advance the catheter past this area of resistance and removed the catheter.  Patient tolerated well.  There was some scant bleeding upon completion of the procedure.   Performed by: Yamilka Lopiccolo, PA-C   Assessment & Plan:   1. 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  I did find an area of resistance consistent with stricture, and this resolved with catheter  insertion.  - Bladder Scan (Post Void Residual) in office  2. Balanitis Beefy red glans penis on physical exam, will start topical clotrimazole  for balanitis. - clotrimazole  (LOTRIMIN ) 1 % cream; Apply 1 Application topically 2 (two) times daily for 7 days.  Dispense: 28 g; Refill: 0  Return for Keep follow-up as scheduled.  Lucie Hones, PA-C  [facility] [address], [address] [place], [place] [address] [phone]

## 2024-03-13 NOTE — Telephone Encounter (Signed)
Called pt no answer. Unable to leave message as voicemail is full. 2nd attempt.  

## 2024-03-22 NOTE — Telephone Encounter (Signed)
 Letter mailed

## 2024-03-23 ENCOUNTER — Telehealth: Payer: Self-pay

## 2024-03-23 NOTE — Telephone Encounter (Signed)
 Patient called triage line requesting to take Flomax  twice daily instead of once daily. Patient reports blood in urine two days ago along with urinary retention, weak urine stream, and dysuria. Pt states he does not want to repeat catheterization and believes increasing Flomax  to BID (morning and night) would improve symptoms. Please advise.

## 2024-03-24 ENCOUNTER — Telehealth: Payer: Self-pay

## 2024-03-24 NOTE — Telephone Encounter (Signed)
 Left detailed message responding to patient request to increase flomax . (Prior telephone note has patients symptoms). After confirming with the PA's I let him know that he can increase his flomax  to BID and to call us  on Monday to let us  know if his symptoms have resolved. Also let patient know that if he goes into retention he needs to seek medical attention in the ER.

## 2024-03-28 NOTE — Telephone Encounter (Signed)
 Contacted patient and at this time he would not like to self cath. We scheduled him for 12/30 with Sam to take a look at his issue. He feels he still is having difficulty urinating but is still going at the moment. He knows to go to the ER if he goes into retention.

## 2024-04-04 ENCOUNTER — Ambulatory Visit: Admitting: Physician Assistant

## 2024-04-13 ENCOUNTER — Other Ambulatory Visit: Payer: Self-pay | Admitting: Urology

## 2024-04-13 DIAGNOSIS — R399 Unspecified symptoms and signs involving the genitourinary system: Secondary | ICD-10-CM

## 2024-04-18 MED ORDER — TAMSULOSIN HCL 0.4 MG PO CAPS: 0.4000 mg | ORAL_CAPSULE | Freq: Every day | ORAL | 3 refills | Status: AC

## 2024-04-18 NOTE — Addendum Note (Signed)
 Addended by: Tichina Koebel E on: 04/18/2024 01:18 PM   Modules accepted: Orders

## 2024-05-05 ENCOUNTER — Telehealth: Payer: Self-pay | Admitting: Urology

## 2024-05-05 NOTE — Telephone Encounter (Signed)
 Patient contacted the office requesting release to return to work. Anticipated return-to-work date is Monday, May 15, 2024. Please advise on clearance.

## 2024-05-05 NOTE — Telephone Encounter (Signed)
 Called pt as I did not see any documentation in his chart that he was written out of work. Pt states that he had surgery on 02/28/24 and its planning to return to work 05/15/2024. Advised pt that this is not the customary length of time given for a Holep. Letter provided.

## 2024-06-14 ENCOUNTER — Other Ambulatory Visit

## 2024-06-21 ENCOUNTER — Ambulatory Visit: Admitting: Urology
